# Patient Record
Sex: Female | Born: 1960 | Race: White | Hispanic: No | Marital: Married | Smoking: Never smoker
Health system: Southern US, Community
[De-identification: ages and names within clinical notes are randomized; demographics above are authoritative.]

## PROBLEM LIST (undated history)

## (undated) DIAGNOSIS — G20 Parkinson's disease: Secondary | ICD-10-CM

## (undated) DIAGNOSIS — C189 Malignant neoplasm of colon, unspecified: Secondary | ICD-10-CM

## (undated) DIAGNOSIS — F419 Anxiety disorder, unspecified: Secondary | ICD-10-CM

## (undated) DIAGNOSIS — F32A Depression, unspecified: Secondary | ICD-10-CM

## (undated) DIAGNOSIS — Z9221 Personal history of antineoplastic chemotherapy: Secondary | ICD-10-CM

## (undated) DIAGNOSIS — F329 Major depressive disorder, single episode, unspecified: Secondary | ICD-10-CM

## (undated) DIAGNOSIS — G20A1 Parkinson's disease without dyskinesia, without mention of fluctuations: Secondary | ICD-10-CM

## (undated) DIAGNOSIS — E785 Hyperlipidemia, unspecified: Secondary | ICD-10-CM

## (undated) DIAGNOSIS — K219 Gastro-esophageal reflux disease without esophagitis: Secondary | ICD-10-CM

## (undated) DIAGNOSIS — R251 Tremor, unspecified: Secondary | ICD-10-CM

## (undated) DIAGNOSIS — G709 Myoneural disorder, unspecified: Secondary | ICD-10-CM

## (undated) DIAGNOSIS — T7840XA Allergy, unspecified, initial encounter: Secondary | ICD-10-CM

## (undated) DIAGNOSIS — N3010 Interstitial cystitis (chronic) without hematuria: Secondary | ICD-10-CM

## (undated) DIAGNOSIS — Z8349 Family history of other endocrine, nutritional and metabolic diseases: Secondary | ICD-10-CM

## (undated) DIAGNOSIS — C801 Malignant (primary) neoplasm, unspecified: Secondary | ICD-10-CM

## (undated) DIAGNOSIS — E7800 Pure hypercholesterolemia, unspecified: Secondary | ICD-10-CM

## (undated) HISTORY — DX: Interstitial cystitis (chronic) without hematuria: N30.10

## (undated) HISTORY — DX: Anxiety disorder, unspecified: F41.9

## (undated) HISTORY — DX: Malignant (primary) neoplasm, unspecified: C80.1

## (undated) HISTORY — DX: Family history of other endocrine, nutritional and metabolic diseases: Z83.49

## (undated) HISTORY — DX: Pure hypercholesterolemia, unspecified: E78.00

## (undated) HISTORY — DX: Personal history of antineoplastic chemotherapy: Z92.21

## (undated) HISTORY — DX: Malignant neoplasm of colon, unspecified: C18.9

## (undated) HISTORY — DX: Allergy, unspecified, initial encounter: T78.40XA

## (undated) HISTORY — DX: Parkinson's disease: G20

## (undated) HISTORY — PX: INNER EAR SURGERY: SHX679

## (undated) HISTORY — PX: UPPER GASTROINTESTINAL ENDOSCOPY: SHX188

## (undated) HISTORY — DX: Depression, unspecified: F32.A

## (undated) HISTORY — DX: Parkinson's disease without dyskinesia, without mention of fluctuations: G20.A1

## (undated) HISTORY — PX: POLYPECTOMY: SHX149

## (undated) HISTORY — DX: Gastro-esophageal reflux disease without esophagitis: K21.9

## (undated) HISTORY — DX: Myoneural disorder, unspecified: G70.9

## (undated) HISTORY — DX: Tremor, unspecified: R25.1

## (undated) HISTORY — DX: Hyperlipidemia, unspecified: E78.5

---
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
## 2001-02-16 ENCOUNTER — Other Ambulatory Visit: Admission: RE | Admit: 2001-02-16 | Discharge: 2001-02-16 | Payer: Self-pay | Admitting: Internal Medicine

## 2002-02-16 ENCOUNTER — Other Ambulatory Visit: Admission: RE | Admit: 2002-02-16 | Discharge: 2002-02-16 | Payer: Self-pay | Admitting: Internal Medicine

## 2002-02-22 ENCOUNTER — Ambulatory Visit (HOSPITAL_COMMUNITY): Admission: RE | Admit: 2002-02-22 | Discharge: 2002-02-22 | Payer: Self-pay | Admitting: Internal Medicine

## 2002-02-22 ENCOUNTER — Encounter: Payer: Self-pay | Admitting: Internal Medicine

## 2002-07-05 DIAGNOSIS — C189 Malignant neoplasm of colon, unspecified: Secondary | ICD-10-CM

## 2002-07-05 HISTORY — DX: Malignant neoplasm of colon, unspecified: C18.9

## 2003-05-02 ENCOUNTER — Other Ambulatory Visit: Admission: RE | Admit: 2003-05-02 | Discharge: 2003-05-02 | Payer: Self-pay | Admitting: Pediatrics

## 2003-07-02 ENCOUNTER — Encounter: Admission: RE | Admit: 2003-07-02 | Discharge: 2003-07-02 | Payer: Self-pay | Admitting: Gastroenterology

## 2003-07-06 HISTORY — PX: COLECTOMY: SHX59

## 2003-07-11 ENCOUNTER — Encounter: Admission: RE | Admit: 2003-07-11 | Discharge: 2003-07-20 | Payer: Self-pay

## 2003-07-16 ENCOUNTER — Inpatient Hospital Stay (HOSPITAL_COMMUNITY): Admission: RE | Admit: 2003-07-16 | Discharge: 2003-07-20 | Payer: Self-pay

## 2003-07-16 ENCOUNTER — Encounter (INDEPENDENT_AMBULATORY_CARE_PROVIDER_SITE_OTHER): Payer: Self-pay | Admitting: Specialist

## 2003-08-13 ENCOUNTER — Ambulatory Visit (HOSPITAL_COMMUNITY): Admission: RE | Admit: 2003-08-13 | Discharge: 2003-08-13 | Payer: Self-pay

## 2003-09-10 ENCOUNTER — Other Ambulatory Visit: Admission: RE | Admit: 2003-09-10 | Discharge: 2003-09-10 | Payer: Self-pay | Admitting: Internal Medicine

## 2003-12-25 ENCOUNTER — Ambulatory Visit (HOSPITAL_COMMUNITY): Admission: RE | Admit: 2003-12-25 | Discharge: 2003-12-25 | Payer: Self-pay | Admitting: Hematology & Oncology

## 2004-02-26 ENCOUNTER — Ambulatory Visit (HOSPITAL_COMMUNITY): Admission: RE | Admit: 2004-02-26 | Discharge: 2004-02-26 | Payer: Self-pay | Admitting: Hematology & Oncology

## 2004-05-11 ENCOUNTER — Ambulatory Visit (HOSPITAL_COMMUNITY): Admission: RE | Admit: 2004-05-11 | Discharge: 2004-05-11 | Payer: Self-pay | Admitting: Hematology & Oncology

## 2004-05-27 ENCOUNTER — Ambulatory Visit: Payer: Self-pay | Admitting: Hematology & Oncology

## 2004-06-19 ENCOUNTER — Ambulatory Visit (HOSPITAL_COMMUNITY): Admission: RE | Admit: 2004-06-19 | Discharge: 2004-06-19 | Payer: Self-pay | Admitting: Gastroenterology

## 2004-08-20 ENCOUNTER — Ambulatory Visit (HOSPITAL_COMMUNITY): Admission: RE | Admit: 2004-08-20 | Discharge: 2004-08-20 | Payer: Self-pay | Admitting: Hematology & Oncology

## 2004-08-25 ENCOUNTER — Ambulatory Visit: Payer: Self-pay | Admitting: Hematology & Oncology

## 2004-11-02 ENCOUNTER — Other Ambulatory Visit: Admission: RE | Admit: 2004-11-02 | Discharge: 2004-11-02 | Payer: Self-pay | Admitting: Internal Medicine

## 2004-11-11 ENCOUNTER — Ambulatory Visit: Payer: Self-pay | Admitting: Hematology & Oncology

## 2004-11-11 ENCOUNTER — Ambulatory Visit (HOSPITAL_COMMUNITY): Admission: RE | Admit: 2004-11-11 | Discharge: 2004-11-11 | Payer: Self-pay | Admitting: Hematology & Oncology

## 2005-03-17 ENCOUNTER — Ambulatory Visit (HOSPITAL_COMMUNITY): Admission: RE | Admit: 2005-03-17 | Discharge: 2005-03-17 | Payer: Self-pay | Admitting: Hematology & Oncology

## 2005-03-17 ENCOUNTER — Ambulatory Visit: Payer: Self-pay | Admitting: Hematology & Oncology

## 2005-07-15 ENCOUNTER — Ambulatory Visit: Payer: Self-pay | Admitting: Hematology & Oncology

## 2005-07-16 ENCOUNTER — Ambulatory Visit (HOSPITAL_COMMUNITY): Admission: RE | Admit: 2005-07-16 | Discharge: 2005-07-16 | Payer: Self-pay | Admitting: Hematology & Oncology

## 2005-11-04 ENCOUNTER — Other Ambulatory Visit: Admission: RE | Admit: 2005-11-04 | Discharge: 2005-11-04 | Payer: Self-pay | Admitting: Internal Medicine

## 2005-11-05 ENCOUNTER — Encounter: Admission: RE | Admit: 2005-11-05 | Discharge: 2005-11-05 | Payer: Self-pay | Admitting: Internal Medicine

## 2005-11-25 ENCOUNTER — Ambulatory Visit: Payer: Self-pay | Admitting: Hematology & Oncology

## 2005-11-25 ENCOUNTER — Ambulatory Visit (HOSPITAL_COMMUNITY): Admission: RE | Admit: 2005-11-25 | Discharge: 2005-11-25 | Payer: Self-pay | Admitting: Hematology & Oncology

## 2005-11-25 LAB — CBC WITH DIFFERENTIAL/PLATELET
BASO%: 0.5 % (ref 0.0–2.0)
Basophils Absolute: 0 10*3/uL (ref 0.0–0.1)
Eosinophils Absolute: 0.1 10*3/uL (ref 0.0–0.5)
HCT: 39.7 % (ref 34.8–46.6)
HGB: 13.6 g/dL (ref 11.6–15.9)
MONO#: 0.5 10*3/uL (ref 0.1–0.9)
NEUT#: 3.7 10*3/uL (ref 1.5–6.5)
NEUT%: 66 % (ref 39.6–76.8)
WBC: 5.6 10*3/uL (ref 3.9–10.0)
lymph#: 1.3 10*3/uL (ref 0.9–3.3)

## 2005-11-25 LAB — CEA: CEA: 1.4 ng/mL (ref 0.0–5.0)

## 2005-11-25 LAB — COMPREHENSIVE METABOLIC PANEL
ALT: 10 U/L (ref 0–40)
CO2: 23 mEq/L (ref 19–32)
Calcium: 8.9 mg/dL (ref 8.4–10.5)
Chloride: 103 mEq/L (ref 96–112)
Creatinine, Ser: 0.7 mg/dL (ref 0.4–1.2)

## 2006-05-23 ENCOUNTER — Ambulatory Visit (HOSPITAL_COMMUNITY): Admission: RE | Admit: 2006-05-23 | Discharge: 2006-05-23 | Payer: Self-pay | Admitting: Hematology & Oncology

## 2006-05-30 ENCOUNTER — Ambulatory Visit: Payer: Self-pay | Admitting: Hematology & Oncology

## 2006-05-30 LAB — CBC WITH DIFFERENTIAL/PLATELET
Eosinophils Absolute: 0 10*3/uL (ref 0.0–0.5)
HCT: 36.2 % (ref 34.8–46.6)
LYMPH%: 20.6 % (ref 14.0–48.0)
MCHC: 34.1 g/dL (ref 32.0–36.0)
MCV: 89.2 fL (ref 81.0–101.0)
MONO#: 0.5 10*3/uL (ref 0.1–0.9)
MONO%: 6.9 % (ref 0.0–13.0)
NEUT#: 5 10*3/uL (ref 1.5–6.5)
NEUT%: 71.4 % (ref 39.6–76.8)
Platelets: 236 10*3/uL (ref 145–400)
WBC: 7 10*3/uL (ref 3.9–10.0)

## 2006-05-30 LAB — COMPREHENSIVE METABOLIC PANEL
CO2: 25 mEq/L (ref 19–32)
Creatinine, Ser: 0.66 mg/dL (ref 0.40–1.20)
Glucose, Bld: 94 mg/dL (ref 70–99)
Total Bilirubin: 0.7 mg/dL (ref 0.3–1.2)

## 2006-05-30 LAB — CEA: CEA: 1.2 ng/mL (ref 0.0–5.0)

## 2006-11-30 ENCOUNTER — Ambulatory Visit: Payer: Self-pay | Admitting: Hematology & Oncology

## 2006-11-30 ENCOUNTER — Ambulatory Visit (HOSPITAL_COMMUNITY): Admission: RE | Admit: 2006-11-30 | Discharge: 2006-11-30 | Payer: Self-pay | Admitting: Hematology & Oncology

## 2006-11-30 LAB — COMPREHENSIVE METABOLIC PANEL
ALT: 13 U/L (ref 0–35)
AST: 19 U/L (ref 0–37)
Albumin: 3.9 g/dL (ref 3.5–5.2)
Calcium: 9 mg/dL (ref 8.4–10.5)
Chloride: 106 mEq/L (ref 96–112)
Potassium: 3.9 mEq/L (ref 3.5–5.3)
Sodium: 136 mEq/L (ref 135–145)
Total Protein: 6.6 g/dL (ref 6.0–8.3)

## 2006-11-30 LAB — CBC WITH DIFFERENTIAL/PLATELET
BASO%: 0.6 % (ref 0.0–2.0)
Basophils Absolute: 0 10*3/uL (ref 0.0–0.1)
EOS%: 0.7 % (ref 0.0–7.0)
HGB: 12.6 g/dL (ref 11.6–15.9)
MCH: 30.8 pg (ref 26.0–34.0)
MCHC: 35.5 g/dL (ref 32.0–36.0)
RDW: 13.4 % (ref 11.3–14.5)
lymph#: 1.1 10*3/uL (ref 0.9–3.3)

## 2006-12-09 ENCOUNTER — Other Ambulatory Visit: Admission: RE | Admit: 2006-12-09 | Discharge: 2006-12-09 | Payer: Self-pay | Admitting: Family Medicine

## 2007-07-07 ENCOUNTER — Ambulatory Visit: Payer: Self-pay | Admitting: Hematology & Oncology

## 2007-07-07 ENCOUNTER — Ambulatory Visit (HOSPITAL_COMMUNITY): Admission: RE | Admit: 2007-07-07 | Discharge: 2007-07-07 | Payer: Self-pay | Admitting: Hematology & Oncology

## 2007-07-07 LAB — COMPREHENSIVE METABOLIC PANEL
AST: 17 U/L (ref 0–37)
Albumin: 4.8 g/dL (ref 3.5–5.2)
Alkaline Phosphatase: 74 U/L (ref 39–117)
BUN: 16 mg/dL (ref 6–23)
Potassium: 3.8 mEq/L (ref 3.5–5.3)
Sodium: 137 mEq/L (ref 135–145)
Total Bilirubin: 0.6 mg/dL (ref 0.3–1.2)

## 2007-07-07 LAB — CEA: CEA: 1.2 ng/mL (ref 0.0–5.0)

## 2007-07-07 LAB — CBC WITH DIFFERENTIAL/PLATELET
EOS%: 2.1 % (ref 0.0–7.0)
MCH: 30.5 pg (ref 26.0–34.0)
MCV: 86.9 fL (ref 81.0–101.0)
MONO%: 6.6 % (ref 0.0–13.0)
RBC: 4.17 10*6/uL (ref 3.70–5.32)
RDW: 10.5 % — ABNORMAL LOW (ref 11.3–14.5)

## 2007-12-11 ENCOUNTER — Other Ambulatory Visit: Admission: RE | Admit: 2007-12-11 | Discharge: 2007-12-11 | Payer: Self-pay | Admitting: Family Medicine

## 2008-02-06 ENCOUNTER — Ambulatory Visit: Payer: Self-pay | Admitting: Hematology & Oncology

## 2008-02-08 ENCOUNTER — Ambulatory Visit (HOSPITAL_COMMUNITY): Admission: RE | Admit: 2008-02-08 | Discharge: 2008-02-08 | Payer: Self-pay | Admitting: Hematology & Oncology

## 2008-02-08 LAB — CBC WITH DIFFERENTIAL/PLATELET
EOS%: 0.7 % (ref 0.0–7.0)
Eosinophils Absolute: 0 10*3/uL (ref 0.0–0.5)
LYMPH%: 17.3 % (ref 14.0–48.0)
MCH: 30.3 pg (ref 26.0–34.0)
MCHC: 34.3 g/dL (ref 32.0–36.0)
MCV: 88.2 fL (ref 81.0–101.0)
MONO%: 6.3 % (ref 0.0–13.0)
NEUT#: 4.6 10*3/uL (ref 1.5–6.5)
Platelets: 204 10*3/uL (ref 145–400)
RBC: 4.33 10*6/uL (ref 3.70–5.32)
RDW: 12.9 % (ref 11.3–14.5)

## 2008-02-08 LAB — COMPREHENSIVE METABOLIC PANEL
AST: 15 U/L (ref 0–37)
Alkaline Phosphatase: 66 U/L (ref 39–117)
Glucose, Bld: 90 mg/dL (ref 70–99)
Potassium: 3.9 mEq/L (ref 3.5–5.3)
Sodium: 137 mEq/L (ref 135–145)
Total Bilirubin: 0.9 mg/dL (ref 0.3–1.2)
Total Protein: 7.2 g/dL (ref 6.0–8.3)

## 2008-02-13 ENCOUNTER — Ambulatory Visit: Payer: Self-pay | Admitting: Hematology & Oncology

## 2008-08-07 ENCOUNTER — Ambulatory Visit (HOSPITAL_COMMUNITY): Admission: RE | Admit: 2008-08-07 | Discharge: 2008-08-07 | Payer: Self-pay | Admitting: Hematology & Oncology

## 2008-08-13 ENCOUNTER — Ambulatory Visit: Payer: Self-pay | Admitting: Hematology & Oncology

## 2008-12-24 ENCOUNTER — Other Ambulatory Visit: Admission: RE | Admit: 2008-12-24 | Discharge: 2008-12-24 | Payer: Self-pay | Admitting: Family Medicine

## 2009-02-19 ENCOUNTER — Ambulatory Visit: Payer: Self-pay | Admitting: Hematology & Oncology

## 2009-02-19 ENCOUNTER — Ambulatory Visit (HOSPITAL_COMMUNITY): Admission: RE | Admit: 2009-02-19 | Discharge: 2009-02-19 | Payer: Self-pay | Admitting: Hematology & Oncology

## 2009-02-19 LAB — CBC WITH DIFFERENTIAL/PLATELET
BASO%: 0.2 % (ref 0.0–2.0)
Basophils Absolute: 0 10*3/uL (ref 0.0–0.1)
EOS%: 0.7 % (ref 0.0–7.0)
HCT: 38.1 % (ref 34.8–46.6)
HGB: 12.9 g/dL (ref 11.6–15.9)
LYMPH%: 19.4 % (ref 14.0–49.7)
MCH: 29.5 pg (ref 25.1–34.0)
MCHC: 33.9 g/dL (ref 31.5–36.0)
MCV: 87.2 fL (ref 79.5–101.0)
MONO%: 6.1 % (ref 0.0–14.0)
NEUT%: 73.6 % (ref 38.4–76.8)
lymph#: 1.1 10*3/uL (ref 0.9–3.3)

## 2009-02-19 LAB — COMPREHENSIVE METABOLIC PANEL
ALT: 19 U/L (ref 0–35)
AST: 21 U/L (ref 0–37)
CO2: 22 mEq/L (ref 19–32)
Chloride: 104 mEq/L (ref 96–112)
Creatinine, Ser: 0.71 mg/dL (ref 0.40–1.20)
Sodium: 139 mEq/L (ref 135–145)
Total Bilirubin: 0.7 mg/dL (ref 0.3–1.2)
Total Protein: 6.9 g/dL (ref 6.0–8.3)

## 2009-02-19 LAB — LACTATE DEHYDROGENASE: LDH: 150 U/L (ref 94–250)

## 2009-02-19 LAB — CEA: CEA: 0.9 ng/mL (ref 0.0–5.0)

## 2009-03-04 ENCOUNTER — Ambulatory Visit: Payer: Self-pay | Admitting: Hematology & Oncology

## 2009-12-29 ENCOUNTER — Other Ambulatory Visit
Admission: RE | Admit: 2009-12-29 | Discharge: 2009-12-29 | Payer: Self-pay | Source: Home / Self Care | Admitting: Family Medicine

## 2010-03-03 ENCOUNTER — Ambulatory Visit: Payer: Self-pay | Admitting: Hematology & Oncology

## 2010-03-04 LAB — COMPREHENSIVE METABOLIC PANEL
ALT: 17 U/L (ref 0–35)
AST: 20 U/L (ref 0–37)
Albumin: 4.6 g/dL (ref 3.5–5.2)
Alkaline Phosphatase: 62 U/L (ref 39–117)
Potassium: 4 mEq/L (ref 3.5–5.3)
Sodium: 139 mEq/L (ref 135–145)
Total Bilirubin: 0.5 mg/dL (ref 0.3–1.2)
Total Protein: 6.5 g/dL (ref 6.0–8.3)

## 2010-03-04 LAB — CBC WITH DIFFERENTIAL (CANCER CENTER ONLY)
BASO%: 0.4 % (ref 0.0–2.0)
EOS%: 2.2 % (ref 0.0–7.0)
Eosinophils Absolute: 0.1 10*3/uL (ref 0.0–0.5)
MCH: 29.5 pg (ref 26.0–34.0)
MCHC: 33.1 g/dL (ref 32.0–36.0)
MONO%: 5.4 % (ref 0.0–13.0)
NEUT#: 4 10*3/uL (ref 1.5–6.5)
Platelets: 227 10*3/uL (ref 145–400)
RBC: 4.38 10*6/uL (ref 3.70–5.32)
RDW: 11.2 % (ref 10.5–14.6)

## 2010-07-26 ENCOUNTER — Encounter: Payer: Self-pay | Admitting: Hematology & Oncology

## 2010-07-30 ENCOUNTER — Ambulatory Visit (INDEPENDENT_AMBULATORY_CARE_PROVIDER_SITE_OTHER)
Admission: RE | Admit: 2010-07-30 | Discharge: 2010-07-30 | Payer: Self-pay | Source: Home / Self Care | Attending: Family Medicine | Admitting: Family Medicine

## 2010-07-30 DIAGNOSIS — Z1231 Encounter for screening mammogram for malignant neoplasm of breast: Secondary | ICD-10-CM

## 2010-10-20 LAB — COMPREHENSIVE METABOLIC PANEL
Alkaline Phosphatase: 67 U/L (ref 39–117)
BUN: 10 mg/dL (ref 6–23)
CO2: 24 mEq/L (ref 19–32)
Calcium: 9.6 mg/dL (ref 8.4–10.5)
GFR calc non Af Amer: >60 mL/min (ref >60)
Glucose, Bld: 103 mg/dL — ABNORMAL HIGH (ref 70–99)
Total Protein: 6.7 g/dL (ref 6.0–8.3)

## 2010-10-20 LAB — CBC
HCT: 38 % (ref 36.0–46.0)
Hemoglobin: 12.6 g/dL (ref 12.0–15.0)
MCV: 89.4 fL (ref 78.0–100.0)
Platelets: 203 10*3/uL (ref 150–400)
RDW: 13 % (ref 11.5–15.5)

## 2010-11-20 NOTE — Op Note (Signed)
NAME:  Lindsay Johnson, Lindsay Johnson                           ACCOUNT NO.:  [id number]   MEDICAL RECORD NO.:  [id number]                   PATIENT TYPE:  AMB   LOCATION:  DAY                                  FACILITY:  [facility]   PHYSICIAN:  Lorre Munroe., M.D.            DATE OF BIRTH:  [birth year]   DATE OF PROCEDURE:  08/13/2003  DATE OF DISCHARGE:                                 OPERATIVE REPORT   PREOPERATIVE DIAGNOSIS:  Carcinoma of the colon, chemotherapy planned.   POSTOPERATIVE DIAGNOSIS:  Carcinoma of the colon, chemotherapy planned.   OPERATION:  Implantation of a venous access port.   SURGEON:  Lebron Conners, M.D.   ANESTHESIA:  Local with sedation.   DESCRIPTION OF PROCEDURE:  After the patient was monitored and sedated and  had routine preparation and draping of the anterior chest and neck, I  liberally infused local anesthetic in the area of the left infraclavicular  region and deltopectoral groove and anterior chest wall.  I made a small  incision just below the clavicle in the deltopectoral groove and with the  patient in Trendelenburg position, I accessed the subclavian vein with a  single stick of the locator needle, and I passed a J-wire through and  confirmed fluoroscopically that it went into the superior vena cava.  I then  created a subcutaneous pocket on the anterior chest wall near the site of  insertion of the wire through approximately a 2 cm incision and removed a  small amount of fat to provide better access to the port.  I then sutured in  the reattached X-Port device with two sutures of 2-0 Prolene, and it lay in  a nice, comfortable position well-covered by the skin.  I passed the venous  tubing through to the venous access incision and then using fl uroscopy cut  it to needed length to reach the distal superior vena cava.  Again with the  patient in Trendelenburg position I passed a dilator and introducer assembly  over the J-wire and removed the wire.  I  then removed the dilator after  confirming that its tip was intravascular and then passed the venous tubing  through the introducer and peeled the introducer out while holding the  catheter in.  Again using fluoroscopy I confirmed that the tip of the tubing  was in the superior vena cava.  This allowed easy injection of fluid and  easy withdrawal of blood.  During the passage of the introducer and dilator  and thereafter, the patient experienced some slight pain and pleurisy in the  right side of the chest.  Chest x-ray is pending at this time.  She remained  stable and had 100% hemoglobin saturation at all times.  After confirming  good position of the catheter fluoroscopically I closed the skin incisions  with intracuticular 4-0 Vicryl and Steri-Strips.  I then put a right angle  Demetrios Isaacs with injection  tubing into the port and filled the port and tubing  with concentrated heparin solution.  The patient then went to PACU in stable  condition.                                               Lorre Munroe., M.D.   Jodi Marble  D:  08/13/2003  T:  08/13/2003  Job:  [id number]   cc:   Rose Phi. Myna Hidalgo, M.D.  [address] [facility]  [place], [place] [address]  Fax: [phone]

## 2010-11-20 NOTE — H&P (Signed)
Lindsay Johnson, Lindsay Johnson [facility]                         ACCOUNT NO.:  [id number]   MEDICAL RECORD NO.:  [id number]                   PATIENT TYPE:  INP   LOCATION:  0009                                 FACILITY:  [facility]   PHYSICIAN:  Lorre Munroe., M.D.            DATE OF BIRTH:  May 19, 1961   DATE OF ADMISSION:  07/16/2003  DATE OF DISCHARGE:                                HISTORY & PHYSICAL   CHIEF COMPLAINT:  Colon cancer.   HISTORY OF PRESENT ILLNESS:  The patient is an otherwise healthy 50 year old  white female who noted rectal bleeding.  She underwent sigmoidoscopy showing  a tumor of the distal sigmoid and biopsy showed carcinoma.  She underwent a  total colonoscopy with the finding of no other lesions.  After taking an  outpatient bowel prep she is admitted to the hospital to undergo resection.  She has had a CT scan which shows no evidence of metastatic disease in the  abdomen or in the liver.   PAST MEDICAL HISTORY:  1. History of interstitial cystitis treated non-operatively.  2. Great deal of trouble taking narcotic medications because of nausea.   ALLERGIES:  SULFA DRUGS.   PAST SURGICAL HISTORY:  1. Ear surgery in 1993 twice.  2. Cystoscopy in 1996 and in 2001.   She denies heart disease, lung disease and other serious chronic problems.   MEDICATIONS:  1. Zoloft 100 mg a daily.  2. Elmiron one tablet daily.   SOCIAL HISTORY:  She does not smoke.  She drinks alcoholic beverages  occasionally and moderately.   REVIEW OF SYMPTOMS:  Remarkable for anxiety and is otherwise unremarkable  except as mentioned above.   PHYSICAL EXAMINATION:  GENERAL:  This is a healthy appearing young woman.  She is in no acute distress.  VITAL SIGNS:  Normal.  HEENT:  Head, neck, eyes, ears, nose, mouth and throat are unremarkable.  Specifically, there is no enlargement of the thyroid and no supraclavicular  lymphadenopathy.  CHEST:  Clear to auscultation.  BREASTS:  Normal.  HEART:   Rate and rhythm are normal.  No murmur or gallop.  ABDOMEN:  No mass, tenderness or organomegaly.  EXTREMITIES:  Normal.  SKIN:  Normal.  There were numerous small skin tags none of which appeared  remarkable.  LYMPH NODES:  No lesions are noted.  NEUROLOGIC:  Normal.   IMPRESSION:  1. Cancer of the sigmoid colon.  2. Multiple soft nevi of the skin.   PLAN:  Sigmoid colectomy and destruction of nevi.                                               Lorre Munroe., M.D.    WB/MEDQ  D:  07/16/2003  T:  07/16/2003  Job:  [id number]

## 2010-11-20 NOTE — Op Note (Signed)
NAMEDAYNE, DEKAY [facility]                         ACCOUNT NO.:  [id number]   MEDICAL RECORD NO.:  [id number]                   PATIENT TYPE:  INP   LOCATION:  0483                                 FACILITY:  [facility]   PHYSICIAN:  Lorre Munroe., M.D.            DATE OF BIRTH:  Dec 22, 1960   DATE OF PROCEDURE:  07/16/2003  DATE OF DISCHARGE:                                 OPERATIVE REPORT   PREOPERATIVE DIAGNOSES:  1. Carcinoma of the sigmoid colon.  2. Multiple nevi of the trunk.   POSTOPERATIVE DIAGNOSES:  1. Carcinoma of the sigmoid colon.  2. Multiple nevi of the trunk.   OPERATION:  1. Sigmoid colectomy with anastomosis.  2. Destruction of nevi.   SURGEON:  Zigmund Daniel, M.D.   ASSISTANT:  Sandria Bales. Ezzard Standing, M.D.   ANESTHESIA:  General.   DESCRIPTION OF PROCEDURE:  After the patient was monitored and anesthetized  and had a Foley catheter in the bladder and routine preparation and draping  of the abdomen, I made a lower midline incision, staying below the umbilicus  and stopping just above the symphysis.  After getting hemostasis in the  subcutaneous tissues, I entered the peritoneal cavity after incising the  fascia and explored the abdomen.  There was a small tumor of the distal  sigmoid colon, and it was quite mobile.  I found no evidence of any  peritoneum metastases, lymph node metastases, or liver metastases.  I found  no other abnormalities.  I placed the patient in Trendelenburg position and  packed the small intestine upward and incised the peritoneum lateral to the  sigmoid colon and the distal descending colon, gaining good mobility.  I  then dissected distally, on the left side passed the tumor, incising the  peritoneum, noting the ureter and avoiding injury to it.  I elected a site  of resection in the proximal sigmoid and divided the bowel between clamps.  I then segmentally divided the mesentery directly back to the sacral  promontory, ligating the  large vessels with 2-0 silk and using the Bovie  otherwise.  I then incised the peritoneum on the right side of the colon  going distally and came back up to the bowel.  I lifted the bowel up from  the sacrum and gained good mobility of the rectosigmoid.  I then used a  couple more clamps to complete the mesenteric resection distally, ligating  with 2-0 silks.  I put stay sutures on the lateral sides of the rectosigmoid  and resected the bowel and inspected it to assure that I had a good distal  margin and found that to be the case.  I saw no polyps or other  abnormalities distally.  I then brought the proximal end of the bowel down,  and I anastomosed it to the distal bowel, utilizing interrupted full-  thickness real silk sutures.  Vascularity was good, and I was well  satisfied  with the anastomosis.  I closed the mesenteric defect with interrupted 2-0  silk sutures.  I copiously irrigated the pelvis and removed the irrigant.  There was no evidence persistent bleeding.  Sponge, needle, and instrument  counts were correct.  I closed the abdominal fascia with #1 PDS and closed  the skin with staples after irrigating the subcutaneous tissues.  I then  shaved off and  cauterized the base of three soft skin tags which the patient had pointed  out to me on the abdominal wall flank and back which were bothering her.  They were not sent as surgical specimens.  After application of bandages,  the patient went to PACU in stable condition.                                               Lorre Munroe., M.D.    WB/MEDQ  D:  07/16/2003  T:  07/16/2003  Job:  [id number]   cc:   Tasia Catchings, M.D.  [address]  [place]  [place] [address]  Fax: [phone]   Marcene Duos, M.D.  [address] [place]  [place] [address]  Fax: [phone]

## 2010-11-20 NOTE — Discharge Summary (Signed)
Lindsay Johnson, MAGGI [facility]                         ACCOUNT NO.:  [id number]   MEDICAL RECORD NO.:  [id number]                   PATIENT TYPE:  INP   LOCATION:  0483                                 FACILITY:  [facility]   PHYSICIAN:  Lorre Munroe., M.D.            DATE OF BIRTH:  1960-09-02   DATE OF ADMISSION:  07/16/2003  DATE OF DISCHARGE:  07/20/2003                                 DISCHARGE SUMMARY   HISTORY:  This is a 50 year old white female, who underwent colonoscopy  because of rectal bleeding.  She was found to have a tumor of the distal  sigmoid colon, and biopsy showed cancer.  By a CT scan and examination, the  patient had no evidence of metastatic disease, and she underwent a bowel  prep at home and entered the hospital for elective colectomy.  She had  agreed to participate in the drug study, studying medication for reduction  of postoperative ileus.   PAST MEDICAL HISTORY:  1. Interstitial cystitis, on Elmiron.  2. Anxiety, Zoloft.  3. She denies serious chronic ailments.  She has a great deal of difficulty     tolerating narcotic medicines.  See the history and physical for greater     details.   PHYSICAL EXAMINATION:  Entirely unremarkable.   HOSPITAL COURSE:  On the day of admission, the patient underwent sigmoid  colectomy with primary anastomosis.  There was no evidence of any liver,  peritoneal, or mesenteric spread of cancer.  Final pathological analysis  disclosed that it was growing to the surface of the bowel, but that all of  the mesenteric lymph nodes were negative for cancer.  She made a very good,  uneventful recovery with full return of GI function by the fourth  postoperative day, at which time her incision also was healing very nicely.  She was allowed to go home with her staples in place on the fourth  postoperative day and was to see me at the office in a few days subsequent  to that to have removal of her staples.   DIAGNOSIS:  Carcinoma of the  distal sigmoid colon.   OPERATION:  Sigmoid colectomy with anastomosis.   DISCHARGE CONDITION:  Improved.                                               Lorre Munroe., M.D.    Jodi Marble  D:  07/27/2003  T:  07/27/2003  Job:  [id number]   cc:   Tasia Catchings, M.D.  [address]  [place]  [place] [address]  Fax: [phone]   [facility]   Marcene Duos, M.D.  [address] [place]  [place] [address]  Fax: [phone]

## 2011-03-03 ENCOUNTER — Encounter (HOSPITAL_BASED_OUTPATIENT_CLINIC_OR_DEPARTMENT_OTHER): Payer: 59 | Admitting: Hematology & Oncology

## 2011-03-03 ENCOUNTER — Other Ambulatory Visit: Payer: Self-pay | Admitting: Hematology & Oncology

## 2011-03-03 DIAGNOSIS — C187 Malignant neoplasm of sigmoid colon: Secondary | ICD-10-CM

## 2011-03-04 LAB — COMPREHENSIVE METABOLIC PANEL
ALT: 15 U/L (ref 0–35)
AST: 15 U/L (ref 0–37)
Albumin: 4.6 g/dL (ref 3.5–5.2)
Alkaline Phosphatase: 69 U/L (ref 39–117)
Glucose, Bld: 101 mg/dL — ABNORMAL HIGH (ref 70–99)
Potassium: 3.7 mEq/L (ref 3.5–5.3)
Sodium: 139 mEq/L (ref 135–145)
Total Protein: 6.4 g/dL (ref 6.0–8.3)

## 2011-03-04 LAB — CEA: CEA: 1.3 ng/mL (ref 0.0–5.0)

## 2011-05-20 ENCOUNTER — Other Ambulatory Visit (HOSPITAL_BASED_OUTPATIENT_CLINIC_OR_DEPARTMENT_OTHER): Payer: Self-pay | Admitting: Internal Medicine

## 2011-05-20 DIAGNOSIS — Z1231 Encounter for screening mammogram for malignant neoplasm of breast: Secondary | ICD-10-CM

## 2011-06-08 DIAGNOSIS — H7010 Chronic mastoiditis, unspecified ear: Secondary | ICD-10-CM | POA: Insufficient documentation

## 2011-06-08 DIAGNOSIS — H9011 Conductive hearing loss, unilateral, right ear, with unrestricted hearing on the contralateral side: Secondary | ICD-10-CM | POA: Insufficient documentation

## 2011-08-02 ENCOUNTER — Ambulatory Visit (HOSPITAL_BASED_OUTPATIENT_CLINIC_OR_DEPARTMENT_OTHER): Payer: 59

## 2011-08-03 ENCOUNTER — Ambulatory Visit (HOSPITAL_BASED_OUTPATIENT_CLINIC_OR_DEPARTMENT_OTHER)
Admission: RE | Admit: 2011-08-03 | Discharge: 2011-08-03 | Disposition: A | Discharge status: Home / Self Care | Payer: 59 | Source: Ambulatory Visit | Attending: Internal Medicine | Admitting: Internal Medicine

## 2011-08-03 DIAGNOSIS — Z1231 Encounter for screening mammogram for malignant neoplasm of breast: Secondary | ICD-10-CM | POA: Insufficient documentation

## 2011-09-27 DIAGNOSIS — J309 Allergic rhinitis, unspecified: Secondary | ICD-10-CM | POA: Insufficient documentation

## 2012-01-27 ENCOUNTER — Telehealth: Payer: Self-pay | Admitting: Hematology & Oncology

## 2012-01-27 NOTE — Telephone Encounter (Signed)
faxed records for research study request

## 2012-02-23 ENCOUNTER — Other Ambulatory Visit: Payer: 59 | Admitting: Lab

## 2012-02-23 ENCOUNTER — Ambulatory Visit (HOSPITAL_BASED_OUTPATIENT_CLINIC_OR_DEPARTMENT_OTHER): Payer: 59 | Admitting: Hematology & Oncology

## 2012-02-23 VITALS — BP 113/72 | HR 78 | Temp 98.2°F | Resp 18 | Ht 64.0 in | Wt 182.0 lb

## 2012-02-23 DIAGNOSIS — Z85038 Personal history of other malignant neoplasm of large intestine: Secondary | ICD-10-CM

## 2012-02-23 DIAGNOSIS — Z9189 Other specified personal risk factors, not elsewhere classified: Secondary | ICD-10-CM

## 2012-02-23 NOTE — Progress Notes (Signed)
This office note has been dictated.

## 2012-02-24 NOTE — Progress Notes (Signed)
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  CURRENT THERAPY:  Observation.  INTERIM HISTORY:  Ms. Locken comes in for her yearly followup.  As always, she got back from her family vacation up in [place].  She got back about 2 weeks ago.  The weather was really nice up there, she said. They had a great time.  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  Her kids are doing well.  She has done well physically.  She sees one of the doctors out at [facility] for her health care.  She had lab work done a week or so ago.  Her vitamin D was a little bit low.  She is taking 2000 units a day.  I told her to double that.  Otherwise, her lab work looked great. She had normal liver function tests.  She was not anemic.  She goes back for a colonoscopy, which is her routine 5-year followup early next year.  She is getting her mammograms.  So far, there have been no problems with respect to her mammograms.  We do follow her CEA levels.  The last ones that we did back last year all looked okay.  Her CEA was 1.3.  This has been holding very stable.  She has had no change in bowel or bladder habits.  There has been no cough or shortness breath.  There has been no leg swelling.  There has been no rashes.  She has had no swallowing difficulties.  PHYSICAL EXAMINATION:  General:  This is a well-developed, well- nourished white female in no obvious distress.  Vital Signs: Temperature 98.2, pulse 78, respiratory rate 18, blood pressure 113/72, weight is 182.  Head and Neck Exam:  Shows a normocephalic, atraumatic skull.  There are no ocular or oral lesions.  She has no palpable cervical or supraclavicular lymph nodes.  Lungs:  Clear bilaterally. Cardiac Exam:  Regular rate and rhythm with a normal S1 and S2.  There are no murmurs, rubs, or bruits.  Abdominal Exam:  Soft  with good bowel sounds.  She has a well-healed laparotomy scar.  There is no guarding or rebound tenderness.  There is no fluid wave.  There is no palpable hepatosplenomegaly.  Back Exam:  No tenderness over the spine, ribs, or hips.  Extremities:  Show no clubbing, cyanosis, or edema.  She has good range of motion of her joints.  Neurological Exam.  No focal neurological deficits.  Skin Exam:  No rashes, ecchymosis, or petechia.  IMPRESSION:  Ms. Gilberti is a very nice 51 year old white female with history of stage II adenocarcinoma of the sigmoid colon.  We did go ahead and give her FOLFOX.  I felt that she was at an increased risk for recurrence and as such, I felt that she would benefit from adjuvant chemotherapy.  She completed chemo back in June 2005.  We will go ahead and plan to get her back in 1 year.  I think that if all looks good next year, we really can consider discharging her from the practice, as I think that she is cured and the risk of recurrence would be less than 2%.    ______________________________ Josph Macho, M.D. PRE/MEDQ  D:  02/23/2012  T:  02/24/2012  Job:  [id number]

## 2012-04-11 ENCOUNTER — Other Ambulatory Visit (HOSPITAL_BASED_OUTPATIENT_CLINIC_OR_DEPARTMENT_OTHER): Payer: Self-pay | Admitting: Internal Medicine

## 2012-04-11 DIAGNOSIS — Z1231 Encounter for screening mammogram for malignant neoplasm of breast: Secondary | ICD-10-CM

## 2012-04-20 DIAGNOSIS — H04209 Unspecified epiphora, unspecified lacrimal gland: Secondary | ICD-10-CM | POA: Insufficient documentation

## 2012-04-20 DIAGNOSIS — H04569 Stenosis of unspecified lacrimal punctum: Secondary | ICD-10-CM | POA: Insufficient documentation

## 2012-08-03 ENCOUNTER — Ambulatory Visit (HOSPITAL_BASED_OUTPATIENT_CLINIC_OR_DEPARTMENT_OTHER)
Admission: RE | Admit: 2012-08-03 | Discharge: 2012-08-03 | Disposition: A | Discharge status: Home / Self Care | Payer: 59 | Source: Ambulatory Visit | Attending: Internal Medicine | Admitting: Internal Medicine

## 2012-08-03 DIAGNOSIS — Z1231 Encounter for screening mammogram for malignant neoplasm of breast: Secondary | ICD-10-CM

## 2013-02-21 ENCOUNTER — Ambulatory Visit: Payer: 59 | Admitting: Hematology & Oncology

## 2013-02-21 ENCOUNTER — Other Ambulatory Visit: Payer: 59 | Admitting: Lab

## 2013-02-21 ENCOUNTER — Other Ambulatory Visit (HOSPITAL_BASED_OUTPATIENT_CLINIC_OR_DEPARTMENT_OTHER): Payer: 59 | Admitting: Lab

## 2013-02-21 ENCOUNTER — Encounter: Payer: Self-pay | Admitting: Hematology & Oncology

## 2013-02-21 ENCOUNTER — Ambulatory Visit (HOSPITAL_BASED_OUTPATIENT_CLINIC_OR_DEPARTMENT_OTHER): Payer: 59 | Admitting: Hematology & Oncology

## 2013-02-21 ENCOUNTER — Telehealth: Payer: Self-pay | Admitting: Hematology & Oncology

## 2013-02-21 VITALS — BP 122/74 | HR 76 | Temp 98.4°F | Resp 16 | Ht 64.0 in | Wt 174.0 lb

## 2013-02-21 DIAGNOSIS — Z9189 Other specified personal risk factors, not elsewhere classified: Secondary | ICD-10-CM

## 2013-02-21 DIAGNOSIS — C187 Malignant neoplasm of sigmoid colon: Secondary | ICD-10-CM

## 2013-02-21 NOTE — Telephone Encounter (Signed)
Mailed 8-19 schedule

## 2013-02-21 NOTE — Progress Notes (Signed)
This office note has been dictated.

## 2013-02-22 ENCOUNTER — Encounter: Payer: Self-pay | Admitting: Hematology & Oncology

## 2013-02-22 NOTE — Progress Notes (Signed)
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  CURRENT THERAPY:  Observation.  INTERIM HISTORY:  Lindsay Johnson comes in for followup.  We see her yearly. As always, she had a good year last year.  She went up to the [place] region in [place].  They have a house up there.  She had a good time up there, she was up there for 2 months.  She recently had a colonoscopy.  This was done out at [facility].  She had a of couple polyps removed.  The pathology on the polyps was adenomatous polyps.  She had mutation testing.  She was negative for any the genetic mutations for colon cancer.  She had a mammogram back in January.  Mammogram looked fine.  She has had no change in bowel or bladder habits.  There has been no nausea or vomiting.  She is losing weight.  She is watching her cholesterol.  She is on Zocor.  PHYSICAL EXAMINATION:  General:  This is a well-developed, well- nourished white female in no obvious distress.  Vital signs: Temperature of 98.4, pulse 76, respiratory rate 16, blood pressure 122/74.  Weight is 174.  Head and neck:  Normocephalic, atraumatic skull.  There are no ocular or oral lesions.  There are no palpable cervical or supraclavicular lymph nodes.  Lungs:  Clear bilaterally. Cardiac:  Regular rate and rhythm with a normal S1 and S2.  There are no murmurs, rubs or bruits.  Abdomen:  Soft.  She has good bowel sounds. There is no fluid wave.  There is no palpable hepatosplenomegaly. Extremities:  Show no clubbing, cyanosis or edema.  Skin:  No rashes, ecchymosis, or petechiae.  LABORATORY STUDIES:  These were not done by [place].  They were done at [facility].  Her CEA was 1.4.  IMPRESSION:  Lindsay Johnson is a very charming 52 year old white female with history of stage II adenocarcinoma of the sigmoid colon.  She did undergo adjuvant chemotherapy.  She had FOLFOX.  She completed chemo back in June of 2005.  We will go  ahead and continue to follow her yearly.  I believe that she probably needs a colonoscopy every 3 years.   ______________________________ Josph Macho, M.D. PRE/MEDQ  D:  02/21/2013  T:  02/22/2013  Job:  [id number]

## 2013-07-05 HISTORY — PX: TYMPANOSTOMY TUBE PLACEMENT: SHX32

## 2014-02-20 ENCOUNTER — Other Ambulatory Visit (HOSPITAL_BASED_OUTPATIENT_CLINIC_OR_DEPARTMENT_OTHER): Payer: 59 | Admitting: Lab

## 2014-02-20 ENCOUNTER — Ambulatory Visit (HOSPITAL_BASED_OUTPATIENT_CLINIC_OR_DEPARTMENT_OTHER): Payer: 59 | Admitting: Hematology & Oncology

## 2014-02-20 ENCOUNTER — Encounter: Payer: Self-pay | Admitting: Hematology & Oncology

## 2014-02-20 VITALS — BP 117/67 | HR 58 | Temp 98.2°F | Resp 14 | Ht 64.0 in | Wt 168.0 lb

## 2014-02-20 DIAGNOSIS — C187 Malignant neoplasm of sigmoid colon: Secondary | ICD-10-CM

## 2014-02-20 DIAGNOSIS — Z9189 Other specified personal risk factors, not elsewhere classified: Secondary | ICD-10-CM

## 2014-02-20 DIAGNOSIS — Z85038 Personal history of other malignant neoplasm of large intestine: Secondary | ICD-10-CM

## 2014-02-20 LAB — CMP (CANCER CENTER ONLY)
ALT(SGPT): 6 U/L — ABNORMAL LOW (ref 10–47)
AST: 19 U/L (ref 11–38)
Albumin: 3.7 g/dL (ref 3.3–5.5)
Alkaline Phosphatase: 77 U/L (ref 26–84)
BUN: 14 mg/dL (ref 7–22)
CALCIUM: 9.4 mg/dL (ref 8.0–10.3)
CHLORIDE: 103 meq/L (ref 98–108)
CO2: 28 mEq/L (ref 18–33)
Creat: 0.7 mg/dl (ref 0.6–1.2)
GLUCOSE: 73 mg/dL (ref 73–118)
POTASSIUM: 3.9 meq/L (ref 3.3–4.7)
Sodium: 143 mEq/L (ref 128–145)
Total Bilirubin: 0.7 mg/dl (ref 0.20–1.60)
Total Protein: 6.9 g/dL (ref 6.4–8.1)

## 2014-02-20 LAB — CBC WITH DIFFERENTIAL (CANCER CENTER ONLY)
BASO#: 0 10*3/uL (ref 0.0–0.2)
BASO%: 0.4 % (ref 0.0–2.0)
EOS ABS: 0.1 10*3/uL (ref 0.0–0.5)
EOS%: 1.1 % (ref 0.0–7.0)
HCT: 38.2 % (ref 34.8–46.6)
HGB: 12.7 g/dL (ref 11.6–15.9)
LYMPH#: 1 10*3/uL (ref 0.9–3.3)
LYMPH%: 21.5 % (ref 14.0–48.0)
MCH: 28.8 pg (ref 26.0–34.0)
MCHC: 33.2 g/dL (ref 32.0–36.0)
MCV: 87 fL (ref 81–101)
MONO#: 0.4 10*3/uL (ref 0.1–0.9)
MONO%: 7.9 % (ref 0.0–13.0)
NEUT%: 69.1 % (ref 39.6–80.0)
NEUTROS ABS: 3.2 10*3/uL (ref 1.5–6.5)
PLATELETS: 163 10*3/uL (ref 145–400)
RBC: 4.41 10*6/uL (ref 3.70–5.32)
RDW: 13.6 % (ref 11.1–15.7)
WBC: 4.7 10*3/uL (ref 3.9–10.0)

## 2014-02-20 LAB — CEA: CEA: 1.4 ng/mL (ref 0.0–5.0)

## 2014-02-21 ENCOUNTER — Telehealth: Payer: Self-pay | Admitting: *Deleted

## 2014-02-21 NOTE — Telephone Encounter (Signed)
Message copied by Rico Ala on Thu Feb 21, 2014  9:35 AM ------      Message from: Burney Gauze R      Created: Thu Feb 21, 2014  7:11 AM       Call 0 labs and tumor marker are normal!!  Mahalo!!!  Have a great time in [place]!! pete ------

## 2014-02-21 NOTE — Telephone Encounter (Signed)
Left message on personal cell phone machine

## 2014-02-21 NOTE — Progress Notes (Signed)
Hematology and Oncology Follow Up Visit  Lindsay Johnson [id number] 04-15-1961 53 y.o. 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  Current Therapy:    Observation     Interim History:  Ms.  Lindsay Johnson is back for her yearly followup. She, as always, got up to [place] over the summertime. They have a cottage up in [place]. She had a good time while she was up there. Her kids are doing well.  She and her husband will be going to [place] in October.  She is still teaching part-time. She enjoys this.  Her health has been good. She related has had no complaints. She's trying to lose some weight. She's had no problems with nausea or vomiting. She's had no cough. She's had no change in bowel or bladder habits. She's had no bleeding. There's been no leg swelling. Her last mammogram was back in January.  Medications: Current outpatient prescriptions:Pentosan Polysulfate Sodium (ELMIRON PO), Take by mouth every morning., Disp: , Rfl: ;  sertraline (ZOLOFT) 100 MG tablet, 100 mg daily. Takes 1 1/2 =150 mg daily, Disp: , Rfl: ;  VITAMIN D, CHOLECALCIFEROL, PO, Take 4,000 Units by mouth every morning., Disp: , Rfl: ;  zolpidem (AMBIEN) 10 MG tablet, Take 5 mg by mouth as needed. , Disp: , Rfl:   Allergies:  Allergies  Allergen Reactions  . Codeine     Makes Pt very "hyper"  . Sulfa Antibiotics     rash    Past Medical History, Surgical history, Social history, and Family History were reviewed and updated.  Review of Systems:  as above  Physical Exam:  height is 5\' 4"  (1.626 m) and weight is 168 lb (76.204 kg). Her oral temperature is 98.2 F (36.8 C). Her blood pressure is 117/67 and her pulse is 58. Her respiration is 14.   Well-developed and well-nourished white female in no obvious distress. Head and neck exam shows no ocular or oral lesions. There are no palpable cervical or supraclavicular lymph nodes. Lungs are clear. Cardiac exam  regular in rhythm with no murmurs rubs or bruits. Abdomen is soft. She has good bowel sounds. There is no fluid wave. There is no palpable liver or spleen tip. She is a well-healed laparotomy scar. Back exam no tenderness over the spine ribs or hips. Extremities shows no clubbing cyanosis or edema. Neurological exam shows no focal neurological deficits. Skin exam no rashes, ecchymoses or petechia.  Lab Results  Component Value Date   WBC 4.7 02/20/2014   HGB 12.7 02/20/2014   HCT 38.2 02/20/2014   MCV 87 02/20/2014   PLT 163 02/20/2014     Chemistry      Component Value Date/Time   NA 143 02/20/2014 0959   NA 139 03/03/2011 1314   K 3.9 02/20/2014 0959   K 3.7 03/03/2011 1314   CL 103 02/20/2014 0959   CL 105 03/03/2011 1314   CO2 28 02/20/2014 0959   CO2 24 03/03/2011 1314   BUN 14 02/20/2014 0959   BUN 13 03/03/2011 1314   CREATININE 0.7 02/20/2014 0959   CREATININE 0.62 03/03/2011 1314      Component Value Date/Time   CALCIUM 9.4 02/20/2014 0959   CALCIUM 9.3 03/03/2011 1314   ALKPHOS 77 02/20/2014 0959   ALKPHOS 69 03/03/2011 1314   AST 19 02/20/2014 0959   AST 15 03/03/2011 1314   ALT 6* 02/20/2014 0959   ALT 15 03/03/2011  1314   BILITOT 0.70 02/20/2014 0959   BILITOT 0.5 03/03/2011 1314      CEA is 1.4.   Impression and Plan: Ms. Lindsay Johnson is very nice 53 year old white female. She has a history of stage II adenocarcinoma of the sigmoid colon. She did undergo adjuvant chemotherapy. Completed this 10 years ago in June of 2005. She received FOLFOX.  She is cured. She still likes to come back to see [place] yearly. I certainly have no problems with this.  [name], MD 8/20/20156:24 PM

## 2014-02-25 ENCOUNTER — Telehealth: Payer: Self-pay | Admitting: Hematology & Oncology

## 2014-02-25 NOTE — Telephone Encounter (Signed)
Mailed 02-2015 schedule

## 2014-11-19 ENCOUNTER — Telehealth: Payer: Self-pay | Admitting: Hematology & Oncology

## 2014-11-19 NOTE — Telephone Encounter (Signed)
Patient called and cx 02/26/15 apt and reesch for 03/18/15

## 2015-01-13 ENCOUNTER — Telehealth: Payer: Self-pay | Admitting: Hematology & Oncology

## 2015-01-13 NOTE — Telephone Encounter (Signed)
Pt call in and requested 11/10 appt date and time

## 2015-02-26 ENCOUNTER — Ambulatory Visit: Payer: 59 | Admitting: Hematology & Oncology

## 2015-02-26 ENCOUNTER — Other Ambulatory Visit: Payer: 59

## 2015-03-18 ENCOUNTER — Other Ambulatory Visit: Payer: Self-pay

## 2015-03-18 ENCOUNTER — Ambulatory Visit: Payer: Self-pay | Admitting: Hematology & Oncology

## 2015-05-14 ENCOUNTER — Other Ambulatory Visit: Payer: Self-pay | Admitting: *Deleted

## 2015-05-14 DIAGNOSIS — Z9189 Other specified personal risk factors, not elsewhere classified: Secondary | ICD-10-CM

## 2015-05-15 ENCOUNTER — Other Ambulatory Visit (HOSPITAL_BASED_OUTPATIENT_CLINIC_OR_DEPARTMENT_OTHER): Payer: BLUE CROSS/BLUE SHIELD

## 2015-05-15 ENCOUNTER — Ambulatory Visit (HOSPITAL_BASED_OUTPATIENT_CLINIC_OR_DEPARTMENT_OTHER): Payer: BLUE CROSS/BLUE SHIELD | Admitting: Hematology & Oncology

## 2015-05-15 VITALS — BP 128/76 | HR 73 | Temp 98.2°F | Resp 20 | Wt 178.0 lb

## 2015-05-15 DIAGNOSIS — E7800 Pure hypercholesterolemia, unspecified: Secondary | ICD-10-CM | POA: Insufficient documentation

## 2015-05-15 DIAGNOSIS — H6980 Other specified disorders of Eustachian tube, unspecified ear: Secondary | ICD-10-CM | POA: Insufficient documentation

## 2015-05-15 DIAGNOSIS — Z9189 Other specified personal risk factors, not elsewhere classified: Secondary | ICD-10-CM

## 2015-05-15 DIAGNOSIS — Z85038 Personal history of other malignant neoplasm of large intestine: Secondary | ICD-10-CM | POA: Diagnosis not present

## 2015-05-15 DIAGNOSIS — C189 Malignant neoplasm of colon, unspecified: Secondary | ICD-10-CM

## 2015-05-15 DIAGNOSIS — G4700 Insomnia, unspecified: Secondary | ICD-10-CM | POA: Insufficient documentation

## 2015-05-15 DIAGNOSIS — M818 Other osteoporosis without current pathological fracture: Secondary | ICD-10-CM

## 2015-05-15 DIAGNOSIS — E785 Hyperlipidemia, unspecified: Secondary | ICD-10-CM | POA: Insufficient documentation

## 2015-05-15 DIAGNOSIS — N3010 Interstitial cystitis (chronic) without hematuria: Secondary | ICD-10-CM | POA: Insufficient documentation

## 2015-05-15 DIAGNOSIS — Z85048 Personal history of other malignant neoplasm of rectum, rectosigmoid junction, and anus: Secondary | ICD-10-CM | POA: Insufficient documentation

## 2015-05-15 DIAGNOSIS — R55 Syncope and collapse: Secondary | ICD-10-CM | POA: Insufficient documentation

## 2015-05-15 LAB — COMPREHENSIVE METABOLIC PANEL (CC13)
ALT: 18 U/L (ref 0–55)
AST: 16 U/L (ref 5–34)
Albumin: 4.3 g/dL (ref 3.5–5.0)
Alkaline Phosphatase: 79 U/L (ref 40–150)
Anion Gap: 10 mEq/L (ref 3–11)
BUN: 17 mg/dL (ref 7.0–26.0)
CALCIUM: 9.9 mg/dL (ref 8.4–10.4)
CHLORIDE: 107 meq/L (ref 98–109)
CO2: 24 mEq/L (ref 22–29)
CREATININE: 0.7 mg/dL (ref 0.6–1.1)
EGFR: >90 mL/min/{1.73_m2} (ref >90)
GLUCOSE: 113 mg/dL (ref 70–140)
Potassium: 4.1 mEq/L (ref 3.5–5.1)
SODIUM: 141 meq/L (ref 136–145)
Total Bilirubin: 0.77 mg/dL (ref 0.20–1.20)
Total Protein: 6.8 g/dL (ref 6.4–8.3)

## 2015-05-15 LAB — CBC WITH DIFFERENTIAL (CANCER CENTER ONLY)
BASO#: 0 10*3/uL (ref 0.0–0.2)
BASO%: 0.4 % (ref 0.0–2.0)
EOS%: 1.1 % (ref 0.0–7.0)
Eosinophils Absolute: 0.1 10*3/uL (ref 0.0–0.5)
HCT: 38.6 % (ref 34.8–46.6)
HGB: 12.8 g/dL (ref 11.6–15.9)
LYMPH#: 1.4 10*3/uL (ref 0.9–3.3)
LYMPH%: 24.7 % (ref 14.0–48.0)
MCH: 29.4 pg (ref 26.0–34.0)
MCHC: 33.2 g/dL (ref 32.0–36.0)
MCV: 89 fL (ref 81–101)
MONO#: 0.4 10*3/uL (ref 0.1–0.9)
MONO%: 6.9 % (ref 0.0–13.0)
NEUT%: 66.9 % (ref 39.6–80.0)
NEUTROS ABS: 3.8 10*3/uL (ref 1.5–6.5)
PLATELETS: 175 10*3/uL (ref 145–400)
RBC: 4.36 10*6/uL (ref 3.70–5.32)
RDW: 12.4 % (ref 11.1–15.7)
WBC: 5.6 10*3/uL (ref 3.9–10.0)

## 2015-05-15 NOTE — Progress Notes (Signed)
Hematology and Oncology Follow Up Visit  Lindsay Johnson [id number] [birth year] 54 y.o. 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 We see her yearly. It now has been 12 years since she had treatment for her colon cancer. She likes come back to see [place] for follow-up.  We last saw her about a year or so ago. Since then, she has been known pretty well. She is retired. Her husband lost his job about a year ago and is trying to find work. It sounds like they may end up moving. Their 2 kids are doing well.  She has not had any problems with her bowels or bladder. Have her last mammogram was I think earlier this year. Everything turned out okay.  Of note, she did have genetic testing for her risk of colon cancer. So far, the genetic test have been negative for any specific gene mutation.  She and her husband were out at their [place] in [place] all summer. They just got back about a week ago.  She goes to the dermatologist yearly.  She's had no issues with rashes. His been no leg swelling. Per as she is on Lipitor at 10 mg a day. She is doing well with this without arthralgias.  Overall, her performance status is ECOG 0.  Medications:  Current outpatient prescriptions:  .  atorvastatin (LIPITOR) 20 MG tablet, Take 10 mg by mouth daily. Breaks 20mg  tab in 1/2., Disp: , Rfl:  .  Cyanocobalamin (RA VITAMIN B-12 TR) 1000 MCG TBCR, Take 1,000 mcg by mouth daily., Disp: , Rfl:  .  fluconazole (DIFLUCAN) 100 MG tablet, Take 100 mg by mouth as needed., Disp: , Rfl:  .  pentosan polysulfate (ELMIRON) 100 MG capsule, Take 100 mg by mouth daily., Disp: , Rfl:  .  scopolamine (TRANSDERM-SCOP, 1.5 MG,) 1 MG/3DAYS, Place 1 patch onto the skin., Disp: , Rfl:  .  sertraline (ZOLOFT) 100 MG tablet, 100 mg daily. Takes 1 1/2 =150 mg daily, Disp: , Rfl:  .   VITAMIN D, CHOLECALCIFEROL, PO, Take 4,000 Units by mouth every morning., Disp: , Rfl:  .  zolpidem (AMBIEN) 10 MG tablet, Take 5 mg by mouth as needed. , Disp: , Rfl:   Allergies:  Allergies  Allergen Reactions  . Codeine     Makes Pt very "hyper"  . Sulfa Antibiotics     rash    Past Medical History, Surgical history, Social history, and Family History were reviewed and updated.  Review of Sysas above  Physical Exam:  weight is 178 lb (80.74 kg). Her oral temperature is 98.2 F (36.8 C). Her blood pressure is 128/76 and her pulse is 73. Her respiration is 20.   Wt Readings from Last 3 Encounters:  05/15/15 178 lb (80.74 kg)  02/20/14 168 lb (76.204 kg)  02/21/13 174 lb (78.926 kg)      well-developed and well-nourished white female in no obvious distress. Head and neck exam shows no ocular or oral lesions. There are no palpable cervical or supraclavicular lymph nodes. Lungs are clear bilaterally. Cardiac exam regular rate and rhythm with no murmurs, rubs or bruits. Abdomen is soft. She has good bowel sounds. She has well-healed laparotomy scar. She has no guarding or rebound tenderness. She has no palpable hepatosplenomegaly. Back exam shows no tenderness over the spine,  ribs or hips. Extremities shows no clubbing, cyanosis or edema. Neurological exam shows no focal neurological deficit. Skin exam shows no rashes, ecchymoses or petechia.  Lab Results  Component Value Date   WBC 5.6 05/15/2015   HGB 12.8 05/15/2015   HCT 38.6 05/15/2015   MCV 89 05/15/2015   PLT 175 05/15/2015     Chemistry      Component Value Date/Time   NA 141 05/15/2015 1026   NA 143 02/20/2014 0959   NA 139 03/03/2011 1314   K 4.1 05/15/2015 1026   K 3.9 02/20/2014 0959   K 3.7 03/03/2011 1314   CL 103 02/20/2014 0959   CL 105 03/03/2011 1314   CO2 24 05/15/2015 1026   CO2 28 02/20/2014 0959   CO2 24 03/03/2011 1314   BUN 17.0 05/15/2015 1026   BUN 14 02/20/2014 0959   BUN 13 03/03/2011 1314    CREATININE 0.7 05/15/2015 1026   CREATININE 0.7 02/20/2014 0959   CREATININE 0.62 03/03/2011 1314      Component Value Date/Time   CALCIUM 9.9 05/15/2015 1026   CALCIUM 9.4 02/20/2014 0959   CALCIUM 9.3 03/03/2011 1314   ALKPHOS 79 05/15/2015 1026   ALKPHOS 77 02/20/2014 0959   ALKPHOS 69 03/03/2011 1314   AST 16 05/15/2015 1026   AST 19 02/20/2014 0959   AST 15 03/03/2011 1314   ALT 18 05/15/2015 1026   ALT 6* 02/20/2014 0959   ALT 15 03/03/2011 1314   BILITOT 0.77 05/15/2015 1026   BILITOT 0.70 02/20/2014 0959   BILITOT 0.5 03/03/2011 1314         Impression and Plan: Lindsay Johnson is a 54 year old white female. She had stage II invasive colon cancer 12 years ago. She underwent resection. She underwent adjuvant chemotherapy with FOLFOX.  She's done very well. There's been no evidence of recurrence.  She likes to come back to see [place] he. I will continue to see her back yearly. Again, it gives her peace of mind that she is being followed by oncology.   [name], MD 11/10/20161:39 PM

## 2015-05-16 LAB — CEA: CEA: 1 ng/mL (ref 0.0–5.0)

## 2015-07-06 HISTORY — PX: COLONOSCOPY: SHX174

## 2015-07-12 DIAGNOSIS — M545 Low back pain, unspecified: Secondary | ICD-10-CM | POA: Insufficient documentation

## 2016-05-13 ENCOUNTER — Other Ambulatory Visit: Payer: BLUE CROSS/BLUE SHIELD

## 2016-05-13 ENCOUNTER — Other Ambulatory Visit (HOSPITAL_BASED_OUTPATIENT_CLINIC_OR_DEPARTMENT_OTHER): Payer: BLUE CROSS/BLUE SHIELD

## 2016-05-13 ENCOUNTER — Ambulatory Visit: Payer: BLUE CROSS/BLUE SHIELD | Admitting: Hematology & Oncology

## 2016-05-13 ENCOUNTER — Ambulatory Visit (HOSPITAL_BASED_OUTPATIENT_CLINIC_OR_DEPARTMENT_OTHER): Payer: BLUE CROSS/BLUE SHIELD | Admitting: Hematology & Oncology

## 2016-05-13 VITALS — BP 122/74 | HR 79 | Temp 98.2°F | Resp 18 | Wt 185.4 lb

## 2016-05-13 DIAGNOSIS — M818 Other osteoporosis without current pathological fracture: Secondary | ICD-10-CM

## 2016-05-13 DIAGNOSIS — R635 Abnormal weight gain: Secondary | ICD-10-CM | POA: Diagnosis not present

## 2016-05-13 DIAGNOSIS — Z9189 Other specified personal risk factors, not elsewhere classified: Secondary | ICD-10-CM

## 2016-05-13 DIAGNOSIS — Z85038 Personal history of other malignant neoplasm of large intestine: Secondary | ICD-10-CM

## 2016-05-13 DIAGNOSIS — C189 Malignant neoplasm of colon, unspecified: Secondary | ICD-10-CM

## 2016-05-13 LAB — CBC WITH DIFFERENTIAL (CANCER CENTER ONLY)
BASO#: 0 10*3/uL (ref 0.0–0.2)
BASO%: 0.5 % (ref 0.0–2.0)
EOS ABS: 0.1 10*3/uL (ref 0.0–0.5)
EOS%: 1.7 % (ref 0.0–7.0)
HCT: 35.5 % (ref 34.8–46.6)
HEMOGLOBIN: 12.2 g/dL (ref 11.6–15.9)
LYMPH#: 1.4 10*3/uL (ref 0.9–3.3)
LYMPH%: 23.6 % (ref 14.0–48.0)
MCH: 29.5 pg (ref 26.0–34.0)
MCHC: 34.4 g/dL (ref 32.0–36.0)
MCV: 86 fL (ref 81–101)
MONO#: 0.4 10*3/uL (ref 0.1–0.9)
MONO%: 7.2 % (ref 0.0–13.0)
NEUT%: 67 % (ref 39.6–80.0)
NEUTROS ABS: 3.8 10*3/uL (ref 1.5–6.5)
PLATELETS: 170 10*3/uL (ref 145–400)
RBC: 4.13 10*6/uL (ref 3.70–5.32)
RDW: 12.5 % (ref 11.1–15.7)
WBC: 5.7 10*3/uL (ref 3.9–10.0)

## 2016-05-13 LAB — COMPREHENSIVE METABOLIC PANEL
ALBUMIN: 4 g/dL (ref 3.5–5.0)
ALK PHOS: 81 U/L (ref 40–150)
ALT: 20 U/L (ref 0–55)
AST: 16 U/L (ref 5–34)
Anion Gap: 11 mEq/L (ref 3–11)
BILIRUBIN TOTAL: 0.54 mg/dL (ref 0.20–1.20)
BUN: 17.2 mg/dL (ref 7.0–26.0)
CO2: 22 meq/L (ref 22–29)
Calcium: 9.2 mg/dL (ref 8.4–10.4)
Chloride: 107 mEq/L (ref 98–109)
Creatinine: 0.7 mg/dL (ref 0.6–1.1)
GLUCOSE: 114 mg/dL (ref 70–140)
Potassium: 3.7 mEq/L (ref 3.5–5.1)
SODIUM: 140 meq/L (ref 136–145)
TOTAL PROTEIN: 6.8 g/dL (ref 6.4–8.3)

## 2016-05-13 NOTE — Progress Notes (Signed)
Hematology and Oncology Follow Up Visit  Lindsay Johnson [id number] [birth year] 55 y.o. 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 We see her yearly. It now has been 13 years since she had treatment for her colon cancer. She likes come back to see [place] for follow-up.  She is doing okay. As always, she was up in [place] for the summer.  Her son is working in [place]. Her daughter is working down in [place].  She is working. She is not too keen on her job but realizes that she has to keep working right now.  She is gaining weight. I do not like the fact that she is gaining weight. She's trying to exercise. She's trying to watch what she eats.  She's had no problems with cough or shortness of breath. She's had no change in bowel or bladder habits.   She had her mammogram done this year. Everything came out okay.   She's had no leg swelling. She's had a couple small bruises on her legs. Patient does have proximal with cystitis. She is on a medication for this. This medication does increase the incidence of bruising.   Overall, her performance status is ECOG 0.  Medications:  Current Outpatient Prescriptions:  .  ranitidine (ZANTAC) 150 MG capsule, Take 150 mg by mouth., Disp: , Rfl:  .  atorvastatin (LIPITOR) 20 MG tablet, Take 10 mg by mouth daily. Breaks 20mg  tab in 1/2., Disp: , Rfl:  .  Cholecalciferol (VITAMIN D-1000 MAX ST) 1000 units tablet, Take by mouth., Disp: , Rfl:  .  Cyanocobalamin (RA VITAMIN B-12 TR) 1000 MCG TBCR, Take 1,000 mcg by mouth daily., Disp: , Rfl:  .  pentosan polysulfate (ELMIRON) 100 MG capsule, Take 100 mg by mouth daily., Disp: , Rfl:  .  scopolamine (TRANSDERM-SCOP, 1.5 MG,) 1 MG/3DAYS, Place 1 patch onto the skin., Disp: , Rfl:  .  sertraline (ZOLOFT) 100 MG tablet, 100 mg daily. Takes 1 1/2 =150  mg daily, Disp: , Rfl:  .  zolpidem (AMBIEN) 10 MG tablet, Take 5 mg by mouth as needed. , Disp: , Rfl:   Allergies:  Allergies  Allergen Reactions  . Codeine     Makes Pt very "hyper"  . Sulfa Antibiotics     rash    Past Medical History, Surgical history, Social history, and Family History were reviewed and updated.  Review of Sysas above  Physical Exam:  weight is 185 lb 6.4 oz (84.1 kg). Her oral temperature is 98.2 F (36.8 C). Her blood pressure is 122/74 and her pulse is 79. Her respiration is 18.   Wt Readings from Last 3 Encounters:  05/13/16 185 lb 6.4 oz (84.1 kg)  05/15/15 178 lb (80.7 kg)  02/20/14 168 lb (76.2 kg)      well-developed and well-nourished white female in no obvious distress. Head and neck exam shows no ocular or oral lesions. There are no palpable cervical or supraclavicular lymph nodes. Lungs are clear bilaterally. Cardiac exam regular rate and rhythm with no murmurs, rubs or bruits. Abdomen is soft. She has good bowel sounds. She has well-healed laparotomy scar. She has no guarding or rebound tenderness. She has no palpable hepatosplenomegaly. Back exam shows no tenderness over the spine, ribs or hips. Extremities shows no clubbing, cyanosis or edema. Neurological exam shows  no focal neurological deficit. Skin exam shows no rashes, ecchymoses or petechia.  Lab Results  Component Value Date   WBC 5.7 05/13/2016   HGB 12.2 05/13/2016   HCT 35.5 05/13/2016   MCV 86 05/13/2016   PLT 170 05/13/2016     Chemistry      Component Value Date/Time   NA 141 05/15/2015 1026   K 4.1 05/15/2015 1026   CL 103 02/20/2014 0959   CO2 24 05/15/2015 1026   BUN 17.0 05/15/2015 1026   CREATININE 0.7 05/15/2015 1026      Component Value Date/Time   CALCIUM 9.9 05/15/2015 1026   ALKPHOS 79 05/15/2015 1026   AST 16 05/15/2015 1026   ALT 18 05/15/2015 1026   BILITOT 0.77 05/15/2015 1026         Impression and Plan: Lindsay Johnson is a 55 year old white  female. She had stage II invasive colon cancer 13 years ago. She underwent resection. She underwent adjuvant chemotherapy with FOLFOX.  She's done very well. There's been no evidence of recurrence.  She likes to come back to see [place] he. I will continue to see her back yearly. Again, it gives her peace of mind that she is being followed by oncology.   [name], MD 11/9/20171:38 PM

## 2016-05-14 ENCOUNTER — Encounter: Payer: Self-pay | Admitting: *Deleted

## 2016-05-14 LAB — VITAMIN D 25 HYDROXY (VIT D DEFICIENCY, FRACTURES): Vitamin D, 25-Hydroxy: 31.9 ng/mL (ref 30.0–100.0)

## 2016-05-14 LAB — CEA (IN HOUSE-CHCC): CEA (CHCC-IN HOUSE): 1.33 ng/mL (ref 0.00–5.00)

## 2016-05-14 LAB — CEA (PARALLEL TESTING): CEA: 0.5 ng/mL

## 2016-05-17 ENCOUNTER — Encounter: Payer: Self-pay | Admitting: Hematology & Oncology

## 2016-08-20 DIAGNOSIS — R251 Tremor, unspecified: Secondary | ICD-10-CM | POA: Insufficient documentation

## 2016-08-23 DIAGNOSIS — R41840 Attention and concentration deficit: Secondary | ICD-10-CM | POA: Insufficient documentation

## 2016-08-23 DIAGNOSIS — R42 Dizziness and giddiness: Secondary | ICD-10-CM | POA: Insufficient documentation

## 2016-09-03 ENCOUNTER — Other Ambulatory Visit: Payer: Self-pay | Admitting: Gastroenterology

## 2017-05-05 ENCOUNTER — Encounter: Payer: Self-pay | Admitting: Hematology & Oncology

## 2017-05-09 ENCOUNTER — Other Ambulatory Visit (HOSPITAL_BASED_OUTPATIENT_CLINIC_OR_DEPARTMENT_OTHER): Payer: PRIVATE HEALTH INSURANCE

## 2017-05-09 ENCOUNTER — Ambulatory Visit (HOSPITAL_BASED_OUTPATIENT_CLINIC_OR_DEPARTMENT_OTHER): Payer: PRIVATE HEALTH INSURANCE | Admitting: Hematology & Oncology

## 2017-05-09 VITALS — BP 119/72 | HR 65 | Temp 97.6°F | Resp 18 | Wt 185.8 lb

## 2017-05-09 DIAGNOSIS — R5383 Other fatigue: Secondary | ICD-10-CM | POA: Diagnosis not present

## 2017-05-09 DIAGNOSIS — Z85038 Personal history of other malignant neoplasm of large intestine: Secondary | ICD-10-CM | POA: Diagnosis not present

## 2017-05-09 DIAGNOSIS — D500 Iron deficiency anemia secondary to blood loss (chronic): Secondary | ICD-10-CM

## 2017-05-09 DIAGNOSIS — N951 Menopausal and female climacteric states: Secondary | ICD-10-CM

## 2017-05-09 DIAGNOSIS — C185 Malignant neoplasm of splenic flexure: Secondary | ICD-10-CM

## 2017-05-09 DIAGNOSIS — D510 Vitamin B12 deficiency anemia due to intrinsic factor deficiency: Secondary | ICD-10-CM

## 2017-05-09 DIAGNOSIS — Z9189 Other specified personal risk factors, not elsewhere classified: Secondary | ICD-10-CM

## 2017-05-09 LAB — CBC WITH DIFFERENTIAL (CANCER CENTER ONLY)
BASO#: 0 10*3/uL (ref 0.0–0.2)
BASO%: 0.4 % (ref 0.0–2.0)
EOS%: 1.5 % (ref 0.0–7.0)
Eosinophils Absolute: 0.1 10*3/uL (ref 0.0–0.5)
HCT: 35.6 % (ref 34.8–46.6)
HGB: 12.1 g/dL (ref 11.6–15.9)
LYMPH#: 1.5 10*3/uL (ref 0.9–3.3)
LYMPH%: 26.5 % (ref 14.0–48.0)
MCH: 29.7 pg (ref 26.0–34.0)
MCHC: 34 g/dL (ref 32.0–36.0)
MCV: 88 fL (ref 81–101)
MONO#: 0.4 10*3/uL (ref 0.1–0.9)
MONO%: 7.7 % (ref 0.0–13.0)
NEUT#: 3.5 10*3/uL (ref 1.5–6.5)
NEUT%: 63.9 % (ref 39.6–80.0)
PLATELETS: 170 10*3/uL (ref 145–400)
RBC: 4.07 10*6/uL (ref 3.70–5.32)
RDW: 12.4 % (ref 11.1–15.7)
WBC: 5.5 10*3/uL (ref 3.9–10.0)

## 2017-05-09 LAB — CMP (CANCER CENTER ONLY)
ALK PHOS: 64 U/L (ref 26–84)
ALT: 24 U/L (ref 10–47)
AST: 19 U/L (ref 11–38)
Albumin: 4 g/dL (ref 3.3–5.5)
BUN: 17 mg/dL (ref 7–22)
CHLORIDE: 104 meq/L (ref 98–108)
CO2: 28 meq/L (ref 18–33)
Calcium: 9.7 mg/dL (ref 8.0–10.3)
Creat: 0.9 mg/dl (ref 0.6–1.2)
GLUCOSE: 103 mg/dL (ref 73–118)
POTASSIUM: 3.7 meq/L (ref 3.3–4.7)
Sodium: 143 mEq/L (ref 128–145)
Total Bilirubin: 0.8 mg/dl (ref 0.20–1.60)
Total Protein: 6.5 g/dL (ref 6.4–8.1)

## 2017-05-09 NOTE — Progress Notes (Signed)
Hematology and Oncology Follow Up Visit  AMORITA VANROSSUM [id number] September 21, 1960 56 y.o. 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 Lippens is back for follow-up.  I am not sure exactly what is going on but she is having some issues.  She has been seen by several doctors.  Mostly, her family doctor is trying to help out.  She just feels tired.  She does not have a lot of energy.  She has a "brain fog".  She is still working.  She is not too happy at work.  She has been tested at [facility].  She had a chest x-ray.  This was unremarkable.  She is worried about her cancer coming back.  It is now been 14 years since she had treatment for her cancer.  There is no pain in the hands or feet.  She has no neuropathy.  She is not lost her hair.  She has had a positive ANA.  I am not sure what this really means.  I am sure her family doctor can help out with this.   She has had her mammograms.  She has had her female exams.  Everything has been okay.  I try to reassure her that I just did not think that anything was happening with respect to her cancer.    She has not gained weight.  She has not lost weight.  I do not think she is really exercising.  She has had no leg edema.  She has had no rashes.  She has had no fever.  Overall, her performance status is ECOG 1.    Medications:  Current Outpatient Medications:  .  atorvastatin (LIPITOR) 20 MG tablet, Take 10 mg by mouth daily. Breaks 20mg  tab in 1/2., Disp: , Rfl:  .  Calcium Glycerophosphate (PRELIEF PO), Take daily by mouth., Disp: , Rfl:  .  cholecalciferol (VITAMIN D) 1000 units tablet, Take 1,000 Units daily by mouth. 2000 units daily, Disp: , Rfl:  .  cyanocobalamin 1000 MCG tablet, Take 1,000 mcg daily by mouth., Disp: , Rfl:  .  FLUoxetine (PROZAC) 20 MG tablet, Take 20 mg daily by mouth., Disp: , Rfl:  .  pentosan  polysulfate (ELMIRON) 100 MG capsule, Take 100 mg by mouth daily., Disp: , Rfl:  .  propranolol (INDERAL) 20 MG tablet, Take 20 mg 2 (two) times daily by mouth., Disp: , Rfl:  .  QUERCETIN PO, Take daily by mouth., Disp: , Rfl:  .  ranitidine (ZANTAC) 150 MG capsule, Take 150 mg 2 (two) times daily by mouth. , Disp: , Rfl:   Allergies:  Allergies  Allergen Reactions  . Codeine     Makes Pt very "hyper"  . Sulfa Antibiotics     rash    Past Medical History, Surgical history, Social history, and Family History were reviewed and updated.  Review of System:   As stated in the interim history  Physical Exam:  weight is 185 lb 12 oz (84.3 kg). Her oral temperature is 97.6 F (36.4 C). Her blood pressure is 119/72 and her pulse is 65. Her respiration is 18.   Wt Readings from Last 3 Encounters:  05/09/17 185 lb 12 oz (84.3 kg)  05/13/16 185 lb 6.4 oz (84.1 kg)  05/15/15 178 lb (80.7 kg)     Well-developed and well-nourished white female in no obvious  distress.  Head and neck exam shows no ocular or oral lesions.  There are no palpable cervical or supraclavicular lymph nodes.  Thyroid is nonpalpable.  Lungs are clear bilaterally.  Cardiac exam regular rate and rhythm with no murmurs, rubs or bruits.  Abdomen is soft.  She has well-healed laparoscopy scar.  There is no fluid wave.  There is no palpable liver or spleen tip.  Back exam shows no tenderness over the spine, ribs or hips.  Extremities shows no clubbing, cyanosis or edema.  She has good range of motion of her joints.  Skin exam shows no rashes, ecchymoses or petechia.  Lab Results  Component Value Date   WBC 5.5 05/09/2017   HGB 12.1 05/09/2017   HCT 35.6 05/09/2017   MCV 88 05/09/2017   PLT 170 05/09/2017     Chemistry      Component Value Date/Time   NA 143 05/09/2017 1343   NA 140 05/13/2016 1256   K 3.7 05/09/2017 1343   K 3.7 05/13/2016 1256   CL 104 05/09/2017 1343   CO2 28 05/09/2017 1343   CO2 22 05/13/2016  1256   BUN 17 05/09/2017 1343   BUN 17.2 05/13/2016 1256   CREATININE 0.9 05/09/2017 1343   CREATININE 0.7 05/13/2016 1256      Component Value Date/Time   CALCIUM 9.7 05/09/2017 1343   CALCIUM 9.2 05/13/2016 1256   ALKPHOS 64 05/09/2017 1343   ALKPHOS 81 05/13/2016 1256   AST 19 05/09/2017 1343   AST 16 05/13/2016 1256   ALT 24 05/09/2017 1343   ALT 20 05/13/2016 1256   BILITOT 0.80 05/09/2017 1343   BILITOT 0.54 05/13/2016 1256         Impression and Plan: Ms. Kimm is a 56 year old white female. She had stage II invasive colon cancer 13 years ago. She underwent resection. She underwent adjuvant chemotherapy with FOLFOX.  I am a little puzzled as to what might be going on.  She has had very extensive testing.  I do not think that she needs any scans.  I am not sure what a CT scan could really pick up.  I will check her iron levels.  I will send off a vitamin B12 on her.  I will also send off an estradiol.  It is possible that she may be very low in estradiol and that an estrogen patch might make her feel better.  If her iron levels are low, then we will give her some IV iron.  I spent about 40 minutes with her.  She just feels confident in my office and the fact that we will try and help her out.  I will plan to get her back in 3 months.  We will see how she is feeling.   [name], MD 11/5/20182:37 PM

## 2017-05-10 LAB — VITAMIN B12: Vitamin B12: 941 pg/mL (ref 232–1245)

## 2017-05-10 LAB — ANTINUCLEAR ANTIBODIES, IFA: ANA Titer 1: POSITIVE — AB

## 2017-05-10 LAB — IRON AND TIBC
%SAT: 27 % (ref 21–57)
Iron: 88 ug/dL (ref 41–142)
TIBC: 328 ug/dL (ref 236–444)
UIBC: 240 ug/dL (ref 120–384)

## 2017-05-10 LAB — CEA (IN HOUSE-CHCC): CEA (CHCC-In House): 1.71 ng/mL (ref 0.00–5.00)

## 2017-05-10 LAB — FERRITIN: FERRITIN: 127 ng/mL (ref 9–269)

## 2017-05-10 LAB — TSH: TSH: 0.901 m[IU]/L (ref 0.308–3.960)

## 2017-05-11 LAB — ESTRADIOL, ULTRA SENS: ESTRADIOL, SENSITIVE: 2.8 pg/mL

## 2017-05-12 ENCOUNTER — Encounter: Payer: Self-pay | Admitting: Hematology & Oncology

## 2017-05-12 ENCOUNTER — Ambulatory Visit: Payer: BLUE CROSS/BLUE SHIELD | Admitting: Hematology & Oncology

## 2017-05-12 ENCOUNTER — Other Ambulatory Visit: Payer: BLUE CROSS/BLUE SHIELD

## 2017-07-04 ENCOUNTER — Encounter: Payer: Self-pay | Admitting: Hematology & Oncology

## 2017-07-12 ENCOUNTER — Encounter: Payer: Self-pay | Admitting: Neurology

## 2017-07-25 ENCOUNTER — Encounter: Payer: Self-pay | Admitting: Neurology

## 2017-07-26 NOTE — Progress Notes (Signed)
Subjective:   Lindsay Johnson was seen in consultation in the movement disorder clinic at the request of Dr. Martha Johnson.  Her PCP is Lindsay Banner, MD.  The evaluation is for tremor and balance change.   The records that were made available to me were reviewed.  She has seen 2 other neurologists for the same.  Pt saw Dr. Linus Johnson in 08/2016.  He thought she possibly had a essential tremor, but given her complaints to him of dizziness and cognitive change, he did an MRI of the brain.  This was completed in March, 2018.  I only have the reports, but not the films.  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  He started her on propranolol, 80 mg at follow-up and told her to return in 6 months. She states that she was only started on 20 mg and it helped but then it quit working so she increased to 20 mg bid.  It is not working as well now.  She notes it is lowering her BP but she is asymptommatic with that.   She saw a different neurologist in 05/2017, Dr. Nestor Johnson.  Sleep study was recommended for her complaints of fatigue and cognitive change.  It appears anxiety was also felt to be playing a role.  Repeat MRI was recommended in 6 months to 1 year.  She states that sx's are getting worse.  She cannot walk a straight line.  She feels like she has inner tremor, as well as external tremor.  She has trouble with coordination issues like washing her hair, putting on and off her coat, getting in and out of car and "I feel like I have no muscle strength."  Some paresthesias in all of the fingers on all of the hands.  She admits to neck pain since November.  She has trouble checking her blind spot when driving.  She states that her urologist thinks that neck issues have to do with urinary hesitancy, frequency and urgency.     Tremor started approximately 1 year ago and involves the bilateral UE.  Tremor is most noticeable when using the hands.   There is no family hx of tremor.     Affected by caffeine:  unknown (only drinks decaf coffee) Affected by alcohol:  (2-3 glasses wine/day on weekend and makes tremor and balance worse) Affected by stress:  Yes.   Affected by fatigue:  No. Spills soup if on spoon:  No., but she can feel the tremor Spills glass of liquid if full:  No. Affects ADL's (tying shoes, brushing teeth, etc):  No., but does have some trouble with flossing teeth and with handwriting  Current/Previously tried tremor medications: propranolol  Current medications that may exacerbate tremor:  n/a  Outside reports reviewed: historical medical records, lab reports, office notes and referral letter/letters.  Living situation:  Pt lives with their spouse.  The patient does not do the finances in the home. Husband takes care of finances.   The patient does drive but sometimes she feels that she has a "brain fog" when she drives.  Fatigue got better when she d/c ambien but the brain fog didn't.  The patient does cook.  There is no difficulty remembering common recipes.  The stovetop has rarely been left on accidentally.  ADL's:  The patient is  able to perform her own ADL's. The patient self medicates.The patients bladder and bowel are under good control.   Behavior:  There have been no behavioral changes over the years.  On zoloft and doing well   Allergies  Allergen Reactions  . Codeine     Makes Pt very "hyper"  . Sulfa Antibiotics     rash  . Bupropion Other (See Comments)  . Valium [Diazepam]     hyperactive    Outpatient Encounter Medications as of 07/27/2017  Medication Sig  . atorvastatin (LIPITOR) 20 MG tablet Take 10 mg by mouth daily. Breaks 20mg  tab in 1/2.  Marland Kitchen Calcium Glycerophosphate (PRELIEF PO) Take daily by mouth.  . cholecalciferol (VITAMIN D) 1000 units tablet Take 1,000 Units daily by mouth. 2000 units daily  . co-enzyme Q-10 30 MG capsule Take 30 mg by mouth 3 (three) times daily.  . cyanocobalamin 1000 MCG tablet Take 1,000 mcg  daily by mouth.  . pentosan polysulfate (ELMIRON) 100 MG capsule Take 100 mg by mouth daily.  . propranolol (INDERAL) 20 MG tablet Take 20 mg 2 (two) times daily by mouth.  . sertraline (ZOLOFT) 100 MG tablet Take 150 mg by mouth daily.  . [DISCONTINUED] FLUoxetine (PROZAC) 20 MG tablet Take 20 mg daily by mouth.  . [DISCONTINUED] QUERCETIN PO Take daily by mouth.  . [DISCONTINUED] ranitidine (ZANTAC) 150 MG capsule Take 150 mg 2 (two) times daily by mouth.    No facility-administered encounter medications on file as of 07/27/2017.     Past Medical History:  Diagnosis Date  . Anxiety   . Colon cancer ([place])   . GERD (gastroesophageal reflux disease)   . Hyperlipemia   . Interstitial cystitis   . Tremor     Past Surgical History:  Procedure Laterality Date  . COLECTOMY  2005  . INNER EAR SURGERY     multiple ear surgeries per patient    Social History   Socioeconomic History  . Marital status: Married    Spouse name: Not on file  . Number of children: Not on file  . Years of education: Not on file  . Highest education level: Not on file  Social Needs  . Financial resource strain: Not on file  . Food insecurity - worry: Not on file  . Food insecurity - inability: Not on file  . Transportation needs - medical: Not on file  . Transportation needs - non-medical: Not on file  Occupational History  . Occupation: Print production planner  Tobacco Use  . Smoking status: Never Smoker  . Smokeless tobacco: Never Used  . Tobacco comment: never used tobacco  Substance and Sexual Activity  . Alcohol use: Yes    Comment: wine on weekends   . Drug use: No  . Sexual activity: Not on file  Other Topics Concern  . Not on file  Social History Narrative  . Not on file    Family Status  Relation Name Status  . Mother  Deceased  . Father  Alive  . Sister x2 Alive  . Brother  Alive  . Son  Alive  . Daughter  Alive    Review of Systems Trouble with sleep.  Yells out in dreams per  husband.  A complete 10 system ROS was obtained and was negative apart from what is mentioned.   Objective:   VITALS:   Vitals:   07/27/17 0943  BP: 98/64  Pulse: 68  SpO2: 96%  Weight: 184 lb (83.5 kg)  Height: 5\' 4"  (1.626 m)   Gen:  Appears stated age and in NAD. HEENT:  Normocephalic, atraumatic. The mucous membranes  are moist. The superficial temporal arteries are without ropiness or tenderness. Cardiovascular: Regular rate and rhythm. Lungs: Clear to auscultation bilaterally. Neck: There are no carotid bruits noted bilaterally.  NEUROLOGICAL:  Orientation:  The patient is alert and oriented x 3.  Recent and remote memory are intact.  Attention span and concentration are normal.  Able to name objects and repeat without trouble.  Fund of knowledge is appropriate Cranial nerves: There is good facial symmetry. The pupils are equal round and reactive to light bilaterally. Fundoscopic exam reveals clear disc margins bilaterally. Extraocular muscles are intact and visual fields are full to confrontational testing. Speech is fluent and clear. Soft palate rises symmetrically and there is no tongue deviation. Hearing is intact to conversational tone. Tone: Tone is good throughout. Sensation: Sensation is intact to light touch and pinprick throughout (facial, trunk, extremities). Vibration is intact at the bilateral big toe but slightly decreased compared to hands. There is no extinction with double simultaneous stimulation. There is no sensory dermatomal level identified. Coordination:  The patient has no dysdiadichokinesia or dysmetria. Motor: Strength is 5/5 in the bilateral upper and lower extremities.  Shoulder shrug is equal bilaterally.  There is no pronator drift.  There are no fasciculations noted. DTR's: Deep tendon reflexes are 2+-3/4 at the bilateral biceps, triceps, brachioradialis, patella and achilles.  She does have pectoralis reflexes bilaterally.  No hoffmans or Tromners  responses.    Plantar responses are downgoing bilaterally. Gait and Station: The patient is able to ambulate without difficulty. The patient is able to heel toe walk without any difficulty. The patient is able to ambulate in a tandem fashion with minimal trouble (loses balance a little). The patient is able to stand in the Romberg position.   MOVEMENT EXAM: Tremor:  There is no significant tremor in the UE, with intention or with posture.  The patient is able to draw Archimedes spirals without significant difficulty.  There is no tremor at rest.  The patient is able to pour water from one glass to another without spilling it.  Labs:  The patient has had an extensive number of labs through [facility].  I have reviewed a number of these.  Briefly, her 14 was 75 on March 03, 2017.  In June 22, 2017 her white blood cells were 6.7, hemoglobin 13.1, hematocrit 39.9 and platelets 194.  TSH was 1.687.     Assessment/Plan:   1.  Gait instability with hyperreflexia and neck pain  -We will do MRI of the cervical spine without gad.    -we will repeat her MRI of the brain and add gad this time given hx of colon CA.  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  She asked me about chemo induced PN but I doubt that given that chemo was many years ago and very little evidence of that on PE  -she asked me about lyme disease.  I think that the likelihood is low, especially in this area.  I offered to check.  She had it checked last year and it was negative.  Said that she would defer and have her PCP check at her PE.  2.   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  Could affect cognition.  Encouraged her to schedule sleep study  3.  Tremor  -I didn't see any tremor on her examination.  She stated that she could feel tremor.  It could be that propranolol was covering up tremor but she stated that propranolol wasn't helping it any longer.  I did many  provocative tests and didn't note tremor today.  Wouldn't change propranolol for now  4.  F/u will depend on results of above.  Much greater than 50% of this visit was spent in counseling and coordinating care.  Total face to face time:  60 min  CC:  Lindsay Banner, MD

## 2017-07-27 ENCOUNTER — Ambulatory Visit (INDEPENDENT_AMBULATORY_CARE_PROVIDER_SITE_OTHER): Payer: PRIVATE HEALTH INSURANCE | Admitting: Neurology

## 2017-07-27 ENCOUNTER — Encounter: Payer: Self-pay | Admitting: Neurology

## 2017-07-27 VITALS — BP 98/64 | HR 68 | Ht 64.0 in | Wt 184.0 lb

## 2017-07-27 DIAGNOSIS — Z85038 Personal history of other malignant neoplasm of large intestine: Secondary | ICD-10-CM | POA: Diagnosis not present

## 2017-07-27 DIAGNOSIS — R2681 Unsteadiness on feet: Secondary | ICD-10-CM

## 2017-07-27 DIAGNOSIS — G250 Essential tremor: Secondary | ICD-10-CM | POA: Diagnosis not present

## 2017-07-27 DIAGNOSIS — R292 Abnormal reflex: Secondary | ICD-10-CM

## 2017-07-27 DIAGNOSIS — E348 Other specified endocrine disorders: Secondary | ICD-10-CM | POA: Diagnosis not present

## 2017-07-27 DIAGNOSIS — M542 Cervicalgia: Secondary | ICD-10-CM

## 2017-07-27 NOTE — Patient Instructions (Signed)
1. We have sent a referral to [facility] for your MRI and they will call you directly to schedule your appt. They are located at[address], [place], [place] [address] . If you need to contact them directly please call [phone]. We will call with results.

## 2017-07-28 ENCOUNTER — Telehealth: Payer: Self-pay | Admitting: Neurology

## 2017-07-28 NOTE — Telephone Encounter (Signed)
Pt called and said she wants to know if her MRI can be scheduled in [place] instead of [place] because they are scheduling out

## 2017-07-29 NOTE — Telephone Encounter (Signed)
Spoke with [facility] in [place]. They state they do have a couple options for 08/02/2017. They have to schedule with patient directly, so she is reaching out to the patient to get that scheduled today.

## 2017-08-05 ENCOUNTER — Inpatient Hospital Stay: Admit: 2017-08-05 | Discharge: 2017-08-05 | Disposition: A | Discharge status: Home / Self Care | Payer: Self-pay

## 2017-08-05 ENCOUNTER — Ambulatory Visit
Admission: RE | Admit: 2017-08-05 | Discharge: 2017-08-05 | Disposition: A | Discharge status: Home / Self Care | Payer: PRIVATE HEALTH INSURANCE | Source: Ambulatory Visit | Attending: Neurology | Admitting: Neurology

## 2017-08-05 DIAGNOSIS — E348 Other specified endocrine disorders: Secondary | ICD-10-CM

## 2017-08-05 DIAGNOSIS — M542 Cervicalgia: Secondary | ICD-10-CM

## 2017-08-05 DIAGNOSIS — R2681 Unsteadiness on feet: Secondary | ICD-10-CM

## 2017-08-05 DIAGNOSIS — R292 Abnormal reflex: Secondary | ICD-10-CM

## 2017-08-05 DIAGNOSIS — Z85038 Personal history of other malignant neoplasm of large intestine: Secondary | ICD-10-CM

## 2017-08-05 MED ORDER — GADOBENATE DIMEGLUMINE 529 MG/ML IV SOLN
17.0000 mL | Freq: Once | INTRAVENOUS | Status: AC | PRN
  Administered 2017-08-05: 17 mL via INTRAVENOUS

## 2017-08-08 ENCOUNTER — Encounter: Payer: Self-pay | Admitting: Neurology

## 2017-08-08 ENCOUNTER — Telehealth: Payer: Self-pay | Admitting: Neurology

## 2017-08-08 DIAGNOSIS — R2681 Unsteadiness on feet: Secondary | ICD-10-CM

## 2017-08-08 NOTE — Telephone Encounter (Signed)
-----   Message from [place], DO sent at 08/08/2017  7:34 AM EST ----- Lindsay Johnson, let pt know that I reviewed MRI brain and cervical spine and both look okay.  As we talked about (and in her note), we can schedule EMG to make sure not missing anything else (legs)

## 2017-08-08 NOTE — Telephone Encounter (Signed)
Patient wants to talk to [facility] about the EMG She is sch for Feb 26 please let me know when the orders are in so I may link them

## 2017-08-08 NOTE — Addendum Note (Signed)
Addended byAnnamaria Helling on: 08/08/2017 01:33 PM   Modules accepted: Orders

## 2017-08-08 NOTE — Telephone Encounter (Signed)
Spoke with patient and answered her questions about testing. Okay to link orders.

## 2017-08-08 NOTE — Telephone Encounter (Signed)
Patient wrote Lindsay Johnson. Information given to her via mychart.

## 2017-08-09 ENCOUNTER — Other Ambulatory Visit: Payer: Self-pay | Admitting: Gastroenterology

## 2017-08-09 ENCOUNTER — Encounter: Payer: Self-pay | Admitting: Neurology

## 2017-08-09 ENCOUNTER — Encounter: Payer: Self-pay | Admitting: Hematology & Oncology

## 2017-08-09 NOTE — Telephone Encounter (Signed)
Spoke with [facility] reading room at [phone] and asked them to review MR to make sure they did not see a pineal cyst, as previous MR at [facility] on 09/03/2016 showed "cystic lesion in the pineal region measuring 11 mm in craniocaudal dimension". The doctor who read the MR will not be in until tonight, but will review it.  Dr. Carles Collet Juluis Rainier.

## 2017-08-10 ENCOUNTER — Encounter: Payer: Self-pay | Admitting: Neurology

## 2017-08-10 NOTE — Telephone Encounter (Signed)
This encounter was created in error - please disregard.

## 2017-08-10 NOTE — Telephone Encounter (Signed)
-----   Message from [place], DO sent at 08/10/2017  7:34 AM EST ----- Let pt know that I had radiology measure cyst and it was same size as previous per radiology.

## 2017-08-12 ENCOUNTER — Ambulatory Visit: Payer: PRIVATE HEALTH INSURANCE | Admitting: Hematology & Oncology

## 2017-08-12 ENCOUNTER — Other Ambulatory Visit: Payer: PRIVATE HEALTH INSURANCE

## 2017-08-15 DIAGNOSIS — E063 Autoimmune thyroiditis: Secondary | ICD-10-CM | POA: Insufficient documentation

## 2017-08-15 DIAGNOSIS — Z8349 Family history of other endocrine, nutritional and metabolic diseases: Secondary | ICD-10-CM | POA: Insufficient documentation

## 2017-08-20 ENCOUNTER — Encounter: Payer: Self-pay | Admitting: Neurology

## 2017-08-21 ENCOUNTER — Encounter: Payer: Self-pay | Admitting: Neurology

## 2017-08-30 ENCOUNTER — Encounter: Payer: Self-pay | Admitting: Gastroenterology

## 2017-08-30 ENCOUNTER — Ambulatory Visit (INDEPENDENT_AMBULATORY_CARE_PROVIDER_SITE_OTHER): Payer: PRIVATE HEALTH INSURANCE | Admitting: Neurology

## 2017-08-30 DIAGNOSIS — R2681 Unsteadiness on feet: Secondary | ICD-10-CM | POA: Diagnosis not present

## 2017-08-30 NOTE — Procedures (Signed)
[facility]  [address]  [place], [place] [address] Tel: [phone] Fax:  [phone] Test Date:  08/30/2017  Patient: Lindsay Johnson DOB: 1960-12-23 Physician: Narda Amber, DO  Sex: Female Height: 5\' 4"  Ref Phys: Alonza Bogus, D.O.  ID#: [id number] Temp: 37.5C Technician:    Patient Complaints: This is a 57 year-old female referred for evaluation of imbalance and gait instability.  NCV & EMG Findings: Extensive electrodiagnostic testing of the right lower extremity and additional studies of the left shows:  1. Bilateral sural and superficial peroneal sensory responses are normal. 2. Bilateral peroneal and tibial motor responses are within normal limits. 3. Bilateral tibial H reflex studies are within normal limits. 4. There is no evidence of active or chronic motor axon loss changes affecting any of the tested muscles. Motor unit configuration and recruitment pattern is within normal limits.  Impression: This is a normal study of the lower extremities.   In particular, there is no evidence of a sensorimotor polyneuropathy or lumbosacral radiculopathy.    ___________________________ Narda Amber, DO    Nerve Conduction Studies Anti Sensory Summary Table   Stim Site NR Peak (ms) Norm Peak (ms) P-T Amp (V) Norm P-T Amp  Left Sup Peroneal Anti Sensory (Ant Lat Mall)  12 cm    3.0 <4.6 11.1 >4  Right Sup Peroneal Anti Sensory (Ant Lat Mall)  12 cm    2.5 <4.6 11.3 >4  Left Sural Anti Sensory (Lat Mall)  Calf    3.0 <4.6 27.1 >4  Right Sural Anti Sensory (Lat Mall)  Calf    2.9 <4.6 25.9 >4   Motor Summary Table   Stim Site NR Onset (ms) Norm Onset (ms) O-P Amp (mV) Norm O-P Amp Site1 Site2 Delta-0 (ms) Dist (cm) Vel (m/s) Norm Vel (m/s)  Left Peroneal Motor (Ext Dig Brev)  Ankle    3.1 <6.0 5.6 >2.5 B Fib Ankle 7.1 35.0 49 >40  B Fib    10.2  5.3  Poplt B Fib 1.3 8.0 62 >40  Poplt    11.5  5.2         Right Peroneal Motor (Ext Dig Brev)    Ankle    3.3 <6.0 5.9 >2.5 B Fib Ankle 7.2 36.0 50 >40  B Fib    10.5  5.8  Poplt B Fib 1.4 8.0 57 >40  Poplt    11.9  5.8         Left Tibial Motor (Abd Hall Brev)  Ankle    3.0 <6.0 15.4 >4 Knee Ankle 8.6 38.0 44 >40  Knee    11.6  11.3         Right Tibial Motor (Abd Hall Brev)  Ankle    3.2 <6.0 17.2 >4 Knee Ankle 8.6 39.0 45 >40  Knee    11.8  12.1          H Reflex Studies   NR H-Lat (ms) Lat Norm (ms) L-R H-Lat (ms)  Left Tibial (Gastroc)     34.83 <35 4.08  Right Tibial (Gastroc)     30.75 <35 4.08   EMG   Side Muscle Ins Act Fibs Psw Fasc Number Recrt Dur Dur. Amp Amp. Poly Poly. Comment  Left AntTibialis Nml Nml Nml Nml Nml Nml Nml Nml Nml Nml Nml Nml N/A  Left Gastroc Nml Nml Nml Nml Nml Nml Nml Nml Nml Nml Nml Nml N/A  Left Flex Dig Long Nml Nml Nml Nml Nml Nml  Nml Nml Nml Nml Nml Nml N/A  Left RectFemoris Nml Nml Nml Nml Nml Nml Nml Nml Nml Nml Nml Nml N/A  Left GluteusMed Nml Nml Nml Nml Nml Nml Nml Nml Nml Nml Nml Nml N/A  Right AntTibialis Nml Nml Nml Nml Nml Nml Nml Nml Nml Nml Nml Nml N/A  Right Gastroc Nml Nml Nml Nml Nml Nml Nml Nml Nml Nml Nml Nml N/A  Right Flex Dig Long Nml Nml Nml Nml Nml Nml Nml Nml Nml Nml Nml Nml N/A  Right RectFemoris Nml Nml Nml Nml Nml Nml Nml Nml Nml Nml Nml Nml N/A  Right GluteusMed Nml Nml Nml Nml Nml Nml Nml Nml Nml Nml Nml Nml N/A      Waveforms:

## 2017-08-31 ENCOUNTER — Encounter: Payer: Self-pay | Admitting: Hematology & Oncology

## 2017-08-31 ENCOUNTER — Telehealth: Payer: Self-pay | Admitting: Neurology

## 2017-08-31 NOTE — Telephone Encounter (Signed)
Mychart message sent to patient.

## 2017-08-31 NOTE — Telephone Encounter (Signed)
-----   Message from Alda Berthold, DO sent at 08/31/2017 11:28 AM EST ----- Please inform patient that her nerve testing is normal - no signs of neuropathy. Thanks.

## 2017-09-04 ENCOUNTER — Encounter: Payer: Self-pay | Admitting: Neurology

## 2017-09-12 DIAGNOSIS — K219 Gastro-esophageal reflux disease without esophagitis: Secondary | ICD-10-CM | POA: Insufficient documentation

## 2017-09-26 ENCOUNTER — Other Ambulatory Visit: Payer: Self-pay | Admitting: Gastroenterology

## 2017-09-28 ENCOUNTER — Ambulatory Visit: Payer: PRIVATE HEALTH INSURANCE | Admitting: Neurology

## 2017-10-03 DIAGNOSIS — G4733 Obstructive sleep apnea (adult) (pediatric): Secondary | ICD-10-CM | POA: Insufficient documentation

## 2017-10-04 ENCOUNTER — Encounter: Payer: Self-pay | Admitting: Gastroenterology

## 2017-11-18 ENCOUNTER — Encounter: Payer: Self-pay | Admitting: Gastroenterology

## 2017-11-22 ENCOUNTER — Telehealth: Payer: Self-pay | Admitting: Endocrinology

## 2017-11-22 ENCOUNTER — Telehealth: Payer: Self-pay

## 2017-11-22 NOTE — Telephone Encounter (Signed)
Referred by: Dr.Cumella  [phone]  Family HX od Hypothyroidism  *Requesting [place]    *Referral incomplete, waiting on Labs/Office Notes

## 2017-11-22 NOTE — Telephone Encounter (Signed)
Patient called regarding status of her referral (eRecord 11/18/17). Explained we will contact her post triage to schedule.

## 2017-11-25 NOTE — Telephone Encounter (Signed)
Patient called to schedule new patient appointment. Per note below, we waiting for labs and office visit notes.

## 2017-12-02 NOTE — Telephone Encounter (Signed)
Labs have not yet been received.

## 2017-12-02 NOTE — Telephone Encounter (Signed)
Patient called for an update on her referral, she states she was advised by someone the labs and notes were received, Probation officer cannot confirm. Please return her call at [phone] to advise.

## 2017-12-09 ENCOUNTER — Encounter: Payer: Self-pay | Admitting: Otolaryngology

## 2017-12-09 ENCOUNTER — Ambulatory Visit: Payer: PRIVATE HEALTH INSURANCE | Attending: Otolaryngology | Admitting: Otolaryngology

## 2017-12-09 VITALS — BP 111/67 | HR 60 | Ht 64.0 in | Wt 184.4 lb

## 2017-12-09 DIAGNOSIS — H9011 Conductive hearing loss, unilateral, right ear, with unrestricted hearing on the contralateral side: Secondary | ICD-10-CM

## 2017-12-09 DIAGNOSIS — H6691 Otitis media, unspecified, right ear: Secondary | ICD-10-CM

## 2017-12-09 DIAGNOSIS — Z9622 Myringotomy tube(s) status: Secondary | ICD-10-CM

## 2017-12-09 DIAGNOSIS — H7011 Chronic mastoiditis, right ear: Secondary | ICD-10-CM

## 2017-12-09 NOTE — Progress Notes (Signed)
SUBJECTIVE:  57 y.o. female who was seen in our office on 12/09/2017 for routine ear check. 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 She had been followed by Dr. May in [place] but recently [place] back [place] the [place]. She last surgery was 4 years ago. She has had a tube in the right ear for the past couple of years. Dr. May has replaced it when necessary. Her last visit with Dr. May was in December 2018 and everything was stable at that time. She reports right hearing loss and tinnitus. However, she is able to manage the hearing loss. The tinnitus is also not bothersome to her. She denies ear drainage, pain, and infection. She denies vertigo. She does have some balance issues which are getting worse. This is associated with a hand tremor. She has seen 3 neurologists in [place] without a diagnosis. She is seeing an endocrinologist in the next couple of weeks.    Medications as recorded in the electronic medical record have been reviewed and reconciled. A complete review of systems was performed including constitutional symptoms, eyes/vision, cardiovascular, respiratory, gastrointestinal, genitourinary, musculoskeletal, integumentary, neurological, psychiatric, endocrine, hematologic, allergic and immunologic. All systems reviewed were unremarkable except for those recorded in the history section and the scanned media section of the archives in the electronic medical record. Past medical history, social history, family history, medications and allergies as recorded in the history section and the scanned media section of the archives in the electronic medical record have been reviewed.       OBJECTIVE:  BP 111/67    Pulse 60    Ht 1.626 m (5\' 4" )    Wt 83.6 kg (184 lb 6.4 oz)    BMI 31.65 kg/m   she is in no acute distress.  On the right it appears she has had a canal wall down mastoidectomy. The mastoid cavity  is well aerated. There is minimal debris that was removed. Ear tube is present in the the anterior tympanic membrane. There is no drainage, infection, granulation tissue, or cholesteatoma. The left external auditory canal is clear.  The tympanic membrane is clear and mobile without middle ear effusions.  There is no acute inflammation or sign of acute infection.  The nose and throat are clear without inflammation or drainage.  The neck is benign without adenopathy or masses.  The remainder of the ENT exam is unremarkable. With tuning fork exam hearing is better on the left. Weber does lateralize to the right. Air conduction is greater than bone conduction on the left, bone conduction greater than air conduction on the right.    ASSESSMENT/PLAN:  The status of the exam has been reviewed with the patient. She has no complaints today. The right ear appears healthy and stable. The tube is in position and patent. Follow up in 6 months for recheck.    If I can provide any further information regarding this visit, please feel free to contact me.  Thank you for the opportunity of seeing Carrie Miles and participating in their care.    Sincerely,    Mliss Fritz, MD  Resident Physician  Otolaryngology - Head & Neck Surgery    Attestation: I saw and evaluated the patient.  Patient personally seen and examined.  I agree with the resident's findings and plan of care as documented above.  There are no additions, deletions or corrections.  Medications as recorded in the  electronic medical record have been reviewed and reconciled.  A complete review of systems was performed including constitutional symptoms, eyes/vision, cardiovascular, respiratory, gastrointestinal, genitourinary, musculoskeletal, integumentary, neurological, psychiatric, endocrine, hematologic, allergic and immunologic.  All systems reviewed were unremarkable except for those recorded in the history section and the scanned media section of the archives in the  electronic medical record. Past medical history, social history, family history, medications and allergies as recorded in the history section and the scanned media section of the archives in the electronic medical record have been reviewed.    Her history is as recorded above.  She has had multiple surgeries on the right ear dating back to the 1990s.  She has had no recent complaints regarding the right ear.  There's been no pain, drainage or bleeding.  Subjectively, she feels that she is getting along adequately with the hearing loss and the tinnitus.  She does have some chronic imbalance issues with a tremor.  So far, there has been no diagnosis for the symptoms.  She is scheduled to see an endocrinologist within the next month.    She is in no acute distress.  The external auditory canal on the left is clear.  The tympanic membrane is intact, clear and mobile.  On the right, there is a tube that is intact, clear and functional.  The perforation around the tube is somewhat enlarged.  There is scarring of the tympanic membrane.  She has a canal wall down mastoid cavity.  Dried crusting was cleaned from the attic region.  The lining of the ear is dry, healthy and stable.  There is no evidence of granulation tissue or evolving cholesteatoma.  The nose and throat are clear without inflammation or drainage.  The neck is benign without adenopathy or masses.  The remainder of the ENT exam is unremarkable.  By tuning fork exam, the hearing is better on the left than the right, her Weber lateralizes to the right side and air conduction is greater than bone conduction on the left while air conduction equals bone conduction on the right.    Procedure: Under the microscope, the right ear and mastoid cavity were thoroughly debrided of lining desquamated material by alligator forceps.   There is a tube in the anterior tympanic membrane.  The perforation around the tube is somewhat enlarged.  Nevertheless, this area is clean and  dry without drainage, granulation tissue or any other reactive pathology.  The posterior recesses of the mastoid cavity were tediously debrided.  There is no granulation tissue or evolving cholesteatoma.  The lining of the ear and mastoid cavity is, otherwise, clean, dry, healthy and mature.   She tolerated the procedure well.    The status of the exam has been reviewed with Carrie Miles.  She has been reassured regarding the stability of the exam and the tube.  The right ear was treated with topical boric acid powder.  She will return for follow-up in 6 months or on a when necessary basis.  Obviously, if she moves out of the area in the next 6 months, she will follow-up in her new neighborhood.  Her questions have been answered.    If I can provide any further information regarding this patient's visit, please feel free to contact me.       Caroline More. Lonzo Candy., MD  Associate Professor and Associate Chairman  Department of Otolaryngology/Director, [facility]    .

## 2017-12-10 ENCOUNTER — Encounter: Payer: Self-pay | Admitting: Otolaryngology

## 2017-12-12 ENCOUNTER — Encounter: Payer: Self-pay | Admitting: Otolaryngology

## 2017-12-12 ENCOUNTER — Other Ambulatory Visit: Payer: Self-pay | Admitting: Otolaryngology

## 2017-12-12 DIAGNOSIS — R42 Dizziness and giddiness: Secondary | ICD-10-CM

## 2017-12-12 NOTE — Telephone Encounter (Signed)
Faxing referral per patient's request

## 2017-12-15 ENCOUNTER — Encounter: Payer: Self-pay | Admitting: Neurosurgery

## 2017-12-15 ENCOUNTER — Ambulatory Visit: Payer: PRIVATE HEALTH INSURANCE | Attending: Neurosurgery | Admitting: Neurosurgery

## 2017-12-15 VITALS — BP 113/71 | HR 69 | Ht 64.0 in | Wt 187.0 lb

## 2017-12-15 DIAGNOSIS — R2689 Other abnormalities of gait and mobility: Secondary | ICD-10-CM | POA: Insufficient documentation

## 2017-12-15 DIAGNOSIS — R258 Other abnormal involuntary movements: Secondary | ICD-10-CM | POA: Insufficient documentation

## 2017-12-15 DIAGNOSIS — M542 Cervicalgia: Secondary | ICD-10-CM | POA: Insufficient documentation

## 2017-12-15 DIAGNOSIS — M25562 Pain in left knee: Secondary | ICD-10-CM | POA: Insufficient documentation

## 2017-12-15 NOTE — Progress Notes (Signed)
Carrie Miles was seen in consultation on December 15, 2017 regarding difficulty with her balance.  Carrie Miles is a 57 year old right-handed married former preschool teacher who began having difficulty in about February 2018.  She is accompanied by her husband to today's visit.  She noticed that she started having trouble with tremor in her hands.  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    More recently she is having having trouble with her balance and beginning to have falls.  She was carrying a flower pot in her yard and fell.  She said she turned a corner and fell and she was trying to pick some rhubarb out of her garden and fell.    Patient's care has mostly been in [place].  She has just recently returned to this area.  At the present time her husband is interviewing for jobs and she may be leaving the area depending on where he finds work.    Her left knee is bothering her at the present time but she feels that because she's been helping her sister since her sister had some surgery done. She has had a cortisone injection in her right knee in the past.    She does complain of neck pain that began in November 2018.  She has a difficult time turning when she's trying to check for blind spots when she is driving.  Patient has had an MRI of her cervical spine and was told it was okay.    Patient was seen by rheumatology in December 2018 and did have positive antithyroid antibody but no other abnormalities and her primary care provider's note indicates her thyroid function is normal.    Patient also tells me she had an EMG that was normal.  She denies any difficulty with numbness.    Patient has been seen by at at least 2 neurologists and had an EMG.  She has not been given a specific diagnosis. She has had MRI of her brain and cervical  spine.    Patient is having difficulty with tremors in both hands when she tries to do something.  She has never noticed tremor at rest.    Overall she feels she moves very slowly.  Patient also complaining that she feels foggy and can't really think straight.  Patient's husband adds that he needs to explain things to her several times for her to understand.    Review of systems:  Patient has chronic fatigue, her left knee hurts right now.  Both ears ring.  She feels weak all over.  She does have neck pain.  She has fallen as noted above.  Patient has a history of having had cholesteatoma with surgery in 1993.  Dr. Candace Cruise apparently did a revision surgery on her.  He saw her recently and felt that the cholesteatoma is not contributing to her current problems.  Patient has never had dizziness associated with the cholesteatoma.  Patient currently has a tympanostomy tube in her right ear.  The remainder of her review of systems is negative.    Past Medical History:   Diagnosis Date    Anxiety     dx about 2005    Cancer      colon, cancer free    Family history of thyroid problem     High cholesterol  Interstitial cystitis      Past Surgical History:   Procedure Laterality Date    cholesteotomy surgery      COLONOSCOPY      TYMPANOSTOMY TUBE PLACEMENT Right 2015     Social History     Social History    Marital status: Married     Spouse name: N/A    Number of children: N/A    Years of education: N/A     Social History Main Topics    Smoking status: Never Smoker    Smokeless tobacco: Never Used    Alcohol use 3.6 oz/week     6 Glasses of wine per week      Comment: wine    Drug use: No    Sexual activity: Yes     Partners: Male     Birth control/ protection: Other-see comments     Other Topics Concern    None     Social History Narrative    None     Family History   Problem Relation Age of Onset    Thyroid disease Mother     Alcohol abuse Mother     GERD Father     Heart Disease Father      High Blood Pressure Father     Thyroid disease Father     High cholesterol Father     High Blood Pressure Sister     Alcohol abuse Brother     Alcohol abuse Sister      Current Outpatient Prescriptions   Medication Sig Dispense Refill    pentosan polysulfate (ELMIRON) 100 MG capsule Take 100 mg by mouth 3 times daily (before meals)      sertraline (ZOLOFT) 100 MG tablet Take 150 mg by mouth daily      ranitidine (ZANTAC) 150 MG tablet Take 150 mg by mouth 2 times daily      cholecalciferol (VITAMIN D) 2000 units tablet Take 2,000 Units by mouth daily      cyanocobalamin (VITAMIN B-12) 500 MCG tablet Take 500 mcg by mouth daily      atorvastatin (LIPITOR) 10 MG tablet Take 10 mg by mouth daily      cetirizine (ZYRTEC) 10 MG tablet Take 10 mg by mouth daily      co-enzyme Q-10 (CO-ENZYME Q-10) 100 MG capsule Take 100 mg by mouth daily      propranolol (INDERAL) 20 MG tablet Take 20 mg by mouth 2 times daily       No current facility-administered medications for this visit.      Allergies:  Codeine and Sulfa antibiotics    PHYSICAL EXAM:  Vitals:    12/15/17 0857   BP: 113/71   Pulse: 69   Weight: 84.8 kg (187 lb)   Height: 1.626 m (5\' 4" )       CARDIOVASCULAR/PULMONARY  Supine BP:  132/85      Seated BP:  125/74        Standing BP:  108/69   HR:  68                      HR:  73  HR:  70  She has no complaints of lightheadedness or dizziness with changing position.     Cardiac:  Heart is regular rate and rhythm.  Pulmonary:  Lungs are clear to auscultation.     NEURO EXAM  Mental status:  Patient is alert and conversant.  She does not appear to be  in any acute distress.  Montral cognitive assessment score is 26/30.  She had difficulty in repeating sentences and could not recall 2 of 5 objects.  Her score is considered normal     Cranial nerves:  Cranial nerves reveal pupils are round and reactive to light, extraocular movements are intact.  Facial features are symmetric, tongue is midline, soft palate  elevates symmetrically.  Confrontational fields appear to be full.  Weber lateralizes to the right.  Rinne is normal bilaterally.    Checkerboard:  Gazing at a checkerboard does not provoke any dizziness.    Motor/ROM:  Strength testing reveals her strength to be 5/5 throughout proximally and distally in her arms and legs bilaterally.  She may have some slight increased tone.    Sensory:  Vibratory sensation is 21 seconds in her index fingers and 12 seconds in his great toes bilaterally.    DTR:  Deep tendon reflexes are +2 throughout including her biceps, triceps, brachioradialis, patella and Achilles bilaterally.  Plantar reflexes are downgoing bilaterally.    Coordination/Romberg:  Finger to nose and heel-to-shin are smooth.  She was able to maintain her balance in standard Romberg stance for 20 seconds after practicing.  Rapidly alternating movements are slow.  Finger tapping is slowed in both hands.  .  Gait:  Her gait appears steady however she has decreased arm swing bilaterally.    Modified Hallpike:  Negative bilaterally for dizziness or nystagmus.    Impression:  The patient seems to be presenting with a slowed movements and postural instability.  Neurodegenerative process cannot entirely be excluded.    She has neck pain that has been going on for some time although she has a report of an MRI of the cervical spine does not reveal any stenosis.    Plan:  Patient is asked to sign release of records for the neurologic consultations that were performed within the last year.    Patient is asked to sign a release of records for that central nervous system imaging that has been performed within the last year for the actual disks.    Patient is asked to proceed with flexion extension views of her cervical spine to make certain her cervical spine is stable.    Of note, patient has no insurance at the present time and is quite concerned about her bills.  Physical therapy was offered but she feels it will be too  expensive. She can practice her balance with her back to a corner, feet together and eyes closed with a high back chair in front of her. She can begin with 20 seconds and increase to 1 minute.    Follow-up in 6-8 weeks.

## 2017-12-16 ENCOUNTER — Encounter: Payer: Self-pay | Admitting: Neurosurgery

## 2017-12-16 ENCOUNTER — Encounter: Payer: Self-pay | Admitting: Gastroenterology

## 2017-12-16 NOTE — Telephone Encounter (Signed)
LM, attempt 1 of 2.

## 2017-12-16 NOTE — Telephone Encounter (Signed)
Labs received.  Per Taylor. NA Gen endo.

## 2017-12-17 ENCOUNTER — Encounter: Payer: Self-pay | Admitting: Neurosurgery

## 2017-12-19 NOTE — Telephone Encounter (Signed)
Patient is seeing an Endocrinologist at [facility] and does not wish to make an appointment with [place] at this time.

## 2017-12-21 ENCOUNTER — Encounter: Payer: Self-pay | Admitting: Neurosurgery

## 2017-12-21 ENCOUNTER — Telehealth: Payer: Self-pay

## 2017-12-21 NOTE — Telephone Encounter (Signed)
Patient called about a medication for Parkinsons you said you were willing to try with her.  She would like you to call her at [phone].      Eduard Clos Bhumi Godbey

## 2017-12-22 ENCOUNTER — Inpatient Hospital Stay: Admit: 2017-12-22 | Discharge: 2017-12-22 | Disposition: A | Discharge status: Home / Self Care | Payer: Self-pay

## 2017-12-22 ENCOUNTER — Telehealth: Payer: Self-pay

## 2017-12-22 ENCOUNTER — Other Ambulatory Visit: Payer: Self-pay | Admitting: Gastroenterology

## 2017-12-22 NOTE — Telephone Encounter (Signed)
Spoke to patient.  I have not yet received outside records.  Patient is very anxious and wants to try medications for Parkinson's to see if that works.  My concern is she was orthostatic when I saw her initially.  Patient agreed that she will increase her fluids over the next week and come in for an appointment next Thursday, December 29, 2017.  I will recheck her orthostatics and will yet discuss trial of Sinemet.

## 2017-12-22 NOTE — Telephone Encounter (Signed)
Patient called and would like you to call her back at [phone].        Carrie Miles

## 2017-12-28 ENCOUNTER — Encounter: Payer: Self-pay | Admitting: Neurosurgery

## 2017-12-29 ENCOUNTER — Encounter: Payer: Self-pay | Admitting: Neurosurgery

## 2017-12-29 ENCOUNTER — Ambulatory Visit: Payer: PRIVATE HEALTH INSURANCE | Attending: Neurosurgery | Admitting: Neurosurgery

## 2017-12-29 ENCOUNTER — Other Ambulatory Visit: Payer: Self-pay

## 2017-12-29 VITALS — BP 130/80 | HR 68 | Ht 64.0 in | Wt 185.0 lb

## 2017-12-29 DIAGNOSIS — R258 Other abnormal involuntary movements: Secondary | ICD-10-CM

## 2017-12-29 DIAGNOSIS — R2689 Other abnormalities of gait and mobility: Secondary | ICD-10-CM

## 2017-12-29 NOTE — Progress Notes (Signed)
Carrie Miles was seen in follow-up on December 29, 2017 regarding difficulty with her balance.  She has not had any falls since I saw her last.  She did begin tai chi exercise program.    Since last visit she did have an x-ray of her cervical spine that reveals no fracture or misalignment.  There is also no evidence of dynamic instability.  Images were reviewed    I did receive outside records from:  [facility].  Patient was seen by Dr. Wells Guiles Tat who is apparently a movement disorder specialist.  Assessment and plan revealed gait instability with hyperreflexia and neck pain.  An MRI of her brain and cervical spine were done.  Reports of those images reveal:  MRI cervical spine mild straightening of the normal cervical lordosis no listhesis or malalignment cord shows signal intensity within the cervical spinal cord is normal.  Vertebrae reveal body heights are well maintained without evidence for acute or chronic fracture.  There are no worrisome auspicious lesions.  There are mild degenerative changes with disc bulging at C3-4 through C6-7 without significant stenosis.  There is mild left C5 foraminal narrowing.      MRI of the brain revealed no acute abnormality.    Patient also had EMG test that did not reveal any peripheral neuropathy.    Patient had been treated already with propranolol for tremor however when I discuss how she takes the propranolol she apparently just uses it as needed and is still on her initial bottle of medication. I have no idea how often she uses the medication. There was no tremor evident on the examination of Dr. Carles Collet.    Patient's husband recalls that on follow-up visit Dr. Carles Collet indicated she did not feel the patient's symptoms were neurologic in origin.  No further visits were made.  I do not have the follow-up note.      Patient is wondering if her Zoloft may be a problem and causing her side effects.  She was apparently started on this 15 years ago when she had what she terms a road rage  after her oldest child went middle school.  She does not feel particularly anxious or depressed at the present time.  She has already started to decrease her Zoloft.    With regards to the sleep disturbance that is noted patient tells me she has mild sleep apnea but has elected not to do anything about it.  She was told she would be better if she did not sleep on her back but she does sleep on her back most of the time.  Patient continues to complain that she feels very fatigued.    Patient had numerous blood studies including TSH, CMP, sedimentation rate, CK, CBC and iron studies all of which were normal.  Her B-12 is 879.  She did have a positive ANA at 1:160 but other rheumatologic studies were done and she was evaluated by rheumatology with no rheumatologic diagnosis.      Current Outpatient Prescriptions   Medication Sig Dispense Refill    pentosan polysulfate (ELMIRON) 100 MG capsule Take 100 mg by mouth 3 times daily (before meals)      sertraline (ZOLOFT) 100 MG tablet Take 150 mg by mouth daily      ranitidine (ZANTAC) 150 MG tablet Take 150 mg by mouth 2 times daily      cholecalciferol (VITAMIN D) 2000 units tablet Take 2,000 Units by mouth daily      cyanocobalamin (VITAMIN  B-12) 500 MCG tablet Take 500 mcg by mouth daily      atorvastatin (LIPITOR) 10 MG tablet Take 10 mg by mouth daily      cetirizine (ZYRTEC) 10 MG tablet Take 10 mg by mouth daily      propranolol (INDERAL) 20 MG tablet Take 20 mg by mouth 2 times daily   Only uses      co-enzyme Q-10 (CO-ENZYME Q-10) 100 MG capsule Take 100 mg by mouth daily       No current facility-administered medications for this visit.       Allergies:    Codeine and Sulfa antibiotics    Examination:    Vitals:    12/29/17 0917   BP: 130/80   Pulse: 68   Weight: 83.9 kg (185 lb)   Height: 1.626 m ('5\' 4"' )     Patient appears her stated age.    She can maintain her balance for 20 seconds in standard Romberg stance with eyes closed.  She does lose her  balance in 1/2 sharpened  Romberg stance.    Patient is able to copy a spiral with no difficulty.  There is no evidence of tremor on spiral.    Impression:  Patient continues to appear to be bradykinetic but not clearly parkinsonian.    Plan:  Patient can continue to slowly taper her Zoloft to see if that makes any difference.  She can drop to 100 mg for the next 2 weeks then 100 mg alternating with 50 mg for a week and then 50 mg daily for a week and then discontinue.  If she feels poorly when she is doing that she can stop her taper.    Patient is asked to sleep on her side.  She is asked to bring me her sleep study information.    Patient is asked to record how often she takes her propranolol.    Follow up in 1 month.

## 2017-12-29 NOTE — Telephone Encounter (Signed)
Discussed at visit

## 2018-01-09 ENCOUNTER — Encounter: Payer: Self-pay | Admitting: Neurosurgery

## 2018-01-10 ENCOUNTER — Encounter: Payer: Self-pay | Admitting: Neurosurgery

## 2018-01-19 ENCOUNTER — Ambulatory Visit: Payer: PRIVATE HEALTH INSURANCE | Attending: Neurosurgery | Admitting: Neurosurgery

## 2018-01-19 ENCOUNTER — Encounter: Payer: Self-pay | Admitting: Neurosurgery

## 2018-01-19 VITALS — BP 100/65 | HR 62 | Ht 64.0 in | Wt 187.0 lb

## 2018-01-19 DIAGNOSIS — R2689 Other abnormalities of gait and mobility: Secondary | ICD-10-CM

## 2018-01-19 DIAGNOSIS — R258 Other abnormal involuntary movements: Secondary | ICD-10-CM

## 2018-01-19 MED ORDER — CARBIDOPA-LEVODOPA 25-100 MG PO TABS *I*
ORAL_TABLET | ORAL | 0 refills | Status: DC
Start: 2018-01-19 — End: 2018-02-02

## 2018-01-19 NOTE — Progress Notes (Signed)
Carrie Miles was seen in follow-up on January 19, 2018 regarding difficulty with slowness of movement.    She took herself off of Zoloft and has been off since July 7.  She feels maybe a little bit edgier and is wondering if she should resume Zoloft at some point.    She tried taking propranolol 20 mg twice a day which she has done for 2 weeks.  It has not benefited her in any way that she is aware of.  She does feel somewhat fatigued.    Current Outpatient Prescriptions   Medication Sig Dispense Refill    pentosan polysulfate (ELMIRON) 100 MG capsule Take 100 mg by mouth 3 times daily (before meals)      ranitidine (ZANTAC) 150 MG tablet Take 150 mg by mouth 2 times daily      cholecalciferol (VITAMIN D) 2000 units tablet Take 2,000 Units by mouth daily      cyanocobalamin (VITAMIN B-12) 500 MCG tablet Take 500 mcg by mouth daily      atorvastatin (LIPITOR) 10 MG tablet Take 10 mg by mouth daily      cetirizine (ZYRTEC) 10 MG tablet Take 10 mg by mouth daily      propranolol (INDERAL) 20 MG tablet Take 20 mg by mouth 2 times daily   Only uses      co-enzyme Q-10 (CO-ENZYME Q-10) 100 MG capsule Take 100 mg by mouth daily      carbidopa-levodopa (SINEMET) 25-100 MG per tablet 1/2 tablet twice a day 15 tablet 0     No current facility-administered medications for this visit.      Allergies:  Codeine and Sulfa antibiotics    Examination:  Vitals:    01/19/18 0841   BP: 100/65   Pulse: 62   Weight: 84.8 kg (187 lb)   Height: 1.626 m (5\' 4" )     Patient appears her stated age.    Seated blood pressures 112/59 with a heart rate of 66.  Standing pressures 105/69 with heart rate of 67.  She has no symptoms of lightheadedness and dizziness.    Gait:  Her gait has no arm swing but seems to be able to turn fairly well.    She may have slight increased muscle tone in her arms.    She did draw a spiral for me and there is a bit of shake to her spiral.    She did take a step back towards me when I pulled back on her  shoulders.    Impression:  Patient continues to appear to be bradykinetic.  I'm not sure this is truly Parkinson's.  Unfortunately she has no insurance until October.    Plan:  Discussed option of waiting until October to proceed with a DAT scan however patient and her husband wish to at least trial antiparkinson medication.    Patient is asked to stop her propranolol as I don't think it's doing anything for her and she doesn't either.    After propranolol has been discontinued for 2 days she can trial Sinemet 25/100 one half tablet twice daily.  Side effects including orthostatic hypotension were discussed.    Patient is asked to monitor her blood pressure sitting and standing and stop Sinemet if she becomes lightheaded or does not feel well on the medication for any reason..    Follow-up 1-2 weeks.

## 2018-01-20 ENCOUNTER — Other Ambulatory Visit: Payer: Self-pay

## 2018-01-23 ENCOUNTER — Encounter: Payer: Self-pay | Admitting: Neurosurgery

## 2018-01-26 ENCOUNTER — Encounter: Payer: Self-pay | Admitting: Hematology & Oncology

## 2018-02-01 ENCOUNTER — Encounter: Payer: Self-pay | Admitting: Neurosurgery

## 2018-02-02 ENCOUNTER — Ambulatory Visit: Payer: Self-pay | Admitting: Neurosurgery

## 2018-02-02 ENCOUNTER — Ambulatory Visit: Payer: PRIVATE HEALTH INSURANCE | Attending: Neurosurgery | Admitting: Neurosurgery

## 2018-02-02 ENCOUNTER — Encounter: Payer: Self-pay | Admitting: Neurosurgery

## 2018-02-02 VITALS — BP 118/72 | HR 80 | Ht 64.0 in | Wt 186.0 lb

## 2018-02-02 DIAGNOSIS — R258 Other abnormal involuntary movements: Secondary | ICD-10-CM

## 2018-02-02 DIAGNOSIS — R2689 Other abnormalities of gait and mobility: Secondary | ICD-10-CM

## 2018-02-02 MED ORDER — CARBIDOPA-LEVODOPA 25-100 MG PO TABS *I*
ORAL_TABLET | ORAL | 0 refills | Status: DC
Start: 2018-02-02 — End: 2018-02-14

## 2018-02-02 NOTE — Progress Notes (Signed)
Carrie Miles was seen in follow-up on February 02, 2017 regarding difficulty with her balance.  Today she is complaining of increasing joint pain.  She did try the Sinemet one half tablet twice daily with no particular ill effect.  She did take one whole tablet one day and thought maybe she had a little bit of nausea.  She didn't take any Sinemet this morning. She has not fallen.    Patient did bring me a copy of her sleep study from March 2019.  It does reveal mild obstructive sleep apnea with her lowest oxygen saturation at 81%.  She was recommended to sleep in a nonsupine position which she tells me she is doing.  She was recommended referral to pursue C Pap titration but she still doesn't want to do that.    Patient did go back on her Zoloft.  She says it does make her feel better and less irritable.    Patient will have medical insurance  April 04, 2018.  She prefers to hold off on anything expensive until then.      Current Outpatient Prescriptions   Medication Sig Dispense Refill    sertraline (ZOLOFT) 50 MG tablet Take 50 mg by mouth daily      Pumpkin Seed-Soy Germ (AZO BLADDER CONTROL/GO-LESS PO) Take by mouth      carbidopa-levodopa (SINEMET) 25-100 MG per tablet 1 tablet twice a day. 60 tablet 0    ranitidine (ZANTAC) 150 MG tablet Take 150 mg by mouth 2 times daily      cholecalciferol (VITAMIN D) 2000 units tablet Take 2,000 Units by mouth daily      cyanocobalamin (VITAMIN B-12) 500 MCG tablet Take 500 mcg by mouth daily      atorvastatin (LIPITOR) 10 MG tablet Take 10 mg by mouth daily      cetirizine (ZYRTEC) 10 MG tablet Take 10 mg by mouth daily      co-enzyme Q-10 (CO-ENZYME Q-10) 100 MG capsule Take 100 mg by mouth daily       No current facility-administered medications for this visit.      Allergies:  Codeine and Sulfa antibiotics    Examination:  Vitals:    02/02/18 1048   BP: 118/72   Pulse: 80   Weight: 84.4 kg (186 lb)   Height: 1.626 m (5\' 4" )     Patient appears her stated  age.    Seated blood pressures 118/80 with a heart rate of 80.  Standing pressures 112/80 with a heart rate of 88.  She is not lightheaded.  She has not had any lightheadedness at home on the Sinemet.    Romberg:  She can maintain her balance for 20 seconds.    She may have some increased muscle tone on the right side more than the left side in the arms.  I do not see tremor.    She is able to resist pullback on her shoulders but that it makes her feel quite uncomfortable.    Impression:  Tolerating Sinemet at low dose. Try Sinemet at 25/100 BID to see if this helps her symptoms.    Plan:  Try Sinemet at 25/100 BID.    Follow up in 2 weeks.

## 2018-02-12 ENCOUNTER — Encounter: Payer: Self-pay | Admitting: Otolaryngology

## 2018-02-13 ENCOUNTER — Encounter: Payer: Self-pay | Admitting: Otolaryngology

## 2018-02-14 ENCOUNTER — Encounter: Payer: Self-pay | Admitting: Neurosurgery

## 2018-02-14 ENCOUNTER — Ambulatory Visit: Payer: PRIVATE HEALTH INSURANCE | Attending: Neurosurgery | Admitting: Neurosurgery

## 2018-02-14 VITALS — BP 106/56 | HR 77 | Ht 64.0 in | Wt 186.0 lb

## 2018-02-14 DIAGNOSIS — R2689 Other abnormalities of gait and mobility: Secondary | ICD-10-CM

## 2018-02-14 DIAGNOSIS — R258 Other abnormal involuntary movements: Secondary | ICD-10-CM

## 2018-02-14 MED ORDER — CARBIDOPA-LEVODOPA 25-100 MG PO TABS *I*
ORAL_TABLET | ORAL | 2 refills | Status: DC
Start: 2018-02-14 — End: 2018-03-14

## 2018-02-14 NOTE — Progress Notes (Signed)
Carrie Miles was seen in follow up on 02/14/2018 regarding difficulty with her balance and feeling stiff.  She feels better with the Sinemet and feels it runs out after about 4 hours.      Patient feels her sensation of tremor is gone.    She would like to continue on the medication.     Current Outpatient Prescriptions   Medication Sig Dispense Refill    carbidopa-levodopa (SINEMET) 25-100 MG per tablet One tablet 3 times a day 90 tablet 2    sertraline (ZOLOFT) 50 MG tablet Take 50 mg by mouth daily      Pumpkin Seed-Soy Germ (AZO BLADDER CONTROL/GO-LESS PO) Take by mouth      ranitidine (ZANTAC) 150 MG tablet Take 150 mg by mouth 2 times daily      cholecalciferol (VITAMIN D) 2000 units tablet Take 2,000 Units by mouth daily      cyanocobalamin (VITAMIN B-12) 500 MCG tablet Take 500 mcg by mouth daily      co-enzyme Q-10 (CO-ENZYME Q-10) 100 MG capsule Take 100 mg by mouth daily       No current facility-administered medications for this visit.      Allergies:  Codeine and Sulfa antibiotics    Examination:  Vitals:    02/14/18 0903   BP: 106/56   Pulse: 77   Weight: 84.4 kg (186 lb)   Height: 1.626 m (5\' 4" )     Gait:  She does appear a bit more fluid in her movements.    Romberg:  She can maintain her balance for 20 seconds with no difficulty.    She has no evidence of tremor.    Impression:  Patient feels her gait has improved with the Sinemet.  She does feel that she runs out a medication after about 4-6 hours.    Plan:  Will increase her Sinemet 25/100 to 3 times daily.    Patient has been seen by movement disorder in [place] last year.  She is planning to be back down there probably by mid September or October.  Also by October she will again have medical insurance at that time.  I would like her to return to movement disorder specialist when she returns to [facility].  She will also follow up with her primary care provider in [place].  She will sign medical releases and provide [place]  with her fax numbers.    Follow-up with myself in one month.

## 2018-02-17 ENCOUNTER — Encounter: Payer: Self-pay | Admitting: Neurosurgery

## 2018-02-20 ENCOUNTER — Encounter: Payer: Self-pay | Admitting: Neurosurgery

## 2018-02-21 NOTE — Telephone Encounter (Signed)
Patient will be returning to [facility].  My records will need to be sent to those fax numbers. I believe she signed release last visit.  Patient should have one more follow up with me and then we can send records.

## 2018-03-08 ENCOUNTER — Encounter: Payer: Self-pay | Admitting: Otolaryngology

## 2018-03-14 ENCOUNTER — Encounter: Payer: Self-pay | Admitting: Neurosurgery

## 2018-03-14 ENCOUNTER — Ambulatory Visit: Payer: PRIVATE HEALTH INSURANCE | Attending: Neurosurgery | Admitting: Neurosurgery

## 2018-03-14 VITALS — BP 108/67 | HR 77 | Ht 64.0 in | Wt 186.0 lb

## 2018-03-14 DIAGNOSIS — R258 Other abnormal involuntary movements: Secondary | ICD-10-CM

## 2018-03-14 DIAGNOSIS — R2689 Other abnormalities of gait and mobility: Secondary | ICD-10-CM

## 2018-03-14 MED ORDER — CARBIDOPA-LEVODOPA 25-100 MG PO TABS *I*
ORAL_TABLET | ORAL | 1 refills | Status: DC
Start: 2018-03-14 — End: 2018-03-14

## 2018-03-14 MED ORDER — CARBIDOPA-LEVODOPA 25-100 MG PO TABS *I*
ORAL_TABLET | ORAL | 1 refills | Status: AC
Start: 2018-03-14 — End: ?

## 2018-03-14 NOTE — Progress Notes (Signed)
Carrie Miles was seen in follow-up on March 14, 2018 regarding difficulty sensation of feeling stiff and that she may fall forward when walking.  She was also having difficulty with some fine motor activities.    She has been taking Sinemet CR 25/ 100 three times a day.  Generally she feels like she is moving more fluidly.  She does not feel like she is going to fall forward.  She is having an easier time with grip but still notes her writing is poor.  Sometimes she feels the tremor inside and sometimes it seems to be gone.    She does still feel somewhat stiff and achy.    Patient tells me her family feels she is moving much more quickly and looks better than when she first arrived up [place] in May.    Patient is returning to [facility] on Friday.  Her husband will have medical insurance coverage for her as of April 04, 2018.    Current Outpatient Prescriptions   Medication Sig Dispense Refill    pentosan polysulfate (ELMIRON) 100 MG capsule Take 100 mg by mouth 3 times daily (before meals)      solifenacin (VESICARE) 10 MG tablet Take 10 mg by mouth daily      TURMERIC PO Take by mouth      carbidopa-levodopa (SINEMET) 25-100 MG per tablet One tablet 3 times a day 90 tablet 1    sertraline (ZOLOFT) 50 MG tablet Take 50 mg by mouth daily      Pumpkin Seed-Soy Germ (AZO BLADDER CONTROL/GO-LESS PO) Take by mouth      ranitidine (ZANTAC) 150 MG tablet Take 150 mg by mouth 2 times daily      cholecalciferol (VITAMIN D) 2000 units tablet Take 2,000 Units by mouth daily      cyanocobalamin (VITAMIN B-12) 500 MCG tablet Take 500 mcg by mouth daily      co-enzyme Q-10 (CO-ENZYME Q-10) 100 MG capsule Take 100 mg by mouth daily       No current facility-administered medications for this visit.      Allergies:  Codeine and Sulfa antibiotics    Examination:  Vitals:    03/14/18 0954   BP: 108/67   Pulse: 77   Weight: 84.4 kg (186 lb)   Height: 1.626 m (5\' 4" )     Patient appears her stated age.    Muscle tone  seems to be normal.    She's able to walk in the hallway and turn without any difficulty.  Her gait looks normal.  She seems to be moving more quickly than when I first saw her.    Impression:  Patient seems to have responded to Sinemet with improvement in her balance and gait.    Plan:  Patient is referred to physical therapy for gait, balance and muscle aching.  She will do this [place] once she has Insurance underwriter.    Patient has scheduled herself to be seen by movement disorder specialist in [place] on May 05, 2018.     No follow-up is scheduled with myself at the present time.

## 2018-03-16 ENCOUNTER — Encounter: Payer: Self-pay | Admitting: Neurosurgery

## 2018-03-21 ENCOUNTER — Encounter: Payer: Self-pay | Admitting: Neurosurgery

## 2018-03-22 ENCOUNTER — Encounter: Payer: Self-pay | Admitting: Neurosurgery

## 2018-03-30 DIAGNOSIS — R52 Pain, unspecified: Secondary | ICD-10-CM | POA: Insufficient documentation

## 2018-03-30 DIAGNOSIS — G8929 Other chronic pain: Secondary | ICD-10-CM | POA: Insufficient documentation

## 2018-04-04 DIAGNOSIS — B977 Papillomavirus as the cause of diseases classified elsewhere: Secondary | ICD-10-CM | POA: Insufficient documentation

## 2018-05-01 ENCOUNTER — Encounter: Payer: Self-pay | Admitting: Neurosurgery

## 2018-05-01 NOTE — Telephone Encounter (Signed)
Dr. Carles Collet - [place] records in [place].

## 2018-05-02 ENCOUNTER — Encounter (HOSPITAL_BASED_OUTPATIENT_CLINIC_OR_DEPARTMENT_OTHER): Payer: Self-pay

## 2018-05-02 ENCOUNTER — Other Ambulatory Visit: Payer: Self-pay

## 2018-05-02 ENCOUNTER — Emergency Department (HOSPITAL_BASED_OUTPATIENT_CLINIC_OR_DEPARTMENT_OTHER)
Admission: EM | Admit: 2018-05-02 | Discharge: 2018-05-02 | Disposition: A | Discharge status: Home / Self Care | Payer: 59 | Attending: Emergency Medicine | Admitting: Emergency Medicine

## 2018-05-02 DIAGNOSIS — Z85038 Personal history of other malignant neoplasm of large intestine: Secondary | ICD-10-CM | POA: Insufficient documentation

## 2018-05-02 DIAGNOSIS — R42 Dizziness and giddiness: Secondary | ICD-10-CM | POA: Insufficient documentation

## 2018-05-02 DIAGNOSIS — R110 Nausea: Secondary | ICD-10-CM | POA: Diagnosis present

## 2018-05-02 DIAGNOSIS — Z79899 Other long term (current) drug therapy: Secondary | ICD-10-CM | POA: Diagnosis not present

## 2018-05-02 DIAGNOSIS — F419 Anxiety disorder, unspecified: Secondary | ICD-10-CM | POA: Diagnosis not present

## 2018-05-02 LAB — BASIC METABOLIC PANEL
ANION GAP: 8 (ref 5–15)
BUN: 21 mg/dL — AB (ref 6–20)
CO2: 27 mmol/L (ref 22–32)
Calcium: 9.5 mg/dL (ref 8.9–10.3)
Chloride: 103 mmol/L (ref 98–111)
Creatinine, Ser: 0.62 mg/dL (ref 0.44–1.00)
Glucose, Bld: 122 mg/dL — ABNORMAL HIGH (ref 70–99)
Potassium: 3.6 mmol/L (ref 3.5–5.1)
SODIUM: 138 mmol/L (ref 135–145)

## 2018-05-02 LAB — URINALYSIS, MICROSCOPIC (REFLEX)

## 2018-05-02 LAB — URINALYSIS, ROUTINE W REFLEX MICROSCOPIC
BILIRUBIN URINE: NEGATIVE
Glucose, UA: NEGATIVE mg/dL
KETONES UR: NEGATIVE mg/dL
LEUKOCYTES UA: NEGATIVE
NITRITE: NEGATIVE
Protein, ur: NEGATIVE mg/dL
SPECIFIC GRAVITY, URINE: 1.02 (ref 1.005–1.030)
pH: 6.5 (ref 5.0–8.0)

## 2018-05-02 LAB — CBC
HCT: 41.1 % (ref 36.0–46.0)
Hemoglobin: 13.5 g/dL (ref 12.0–15.0)
MCH: 28.8 pg (ref 26.0–34.0)
MCHC: 32.8 g/dL (ref 30.0–36.0)
MCV: 87.6 fL (ref 80.0–100.0)
NRBC: 0 % (ref 0.0–0.2)
Platelets: 198 10*3/uL (ref 150–400)
RBC: 4.69 MIL/uL (ref 3.87–5.11)
RDW: 12.4 % (ref 11.5–15.5)
WBC: 7.6 10*3/uL (ref 4.0–10.5)

## 2018-05-02 LAB — HEPATIC FUNCTION PANEL
ALT: 5 U/L (ref 0–44)
AST: 16 U/L (ref 15–41)
Albumin: 4.5 g/dL (ref 3.5–5.0)
Alkaline Phosphatase: 67 U/L (ref 38–126)
BILIRUBIN DIRECT: 0.1 mg/dL (ref 0.0–0.2)
BILIRUBIN TOTAL: 0.9 mg/dL (ref 0.3–1.2)
Indirect Bilirubin: 0.8 mg/dL (ref 0.3–0.9)
Total Protein: 7.3 g/dL (ref 6.5–8.1)

## 2018-05-02 LAB — LIPASE, BLOOD: Lipase: 28 U/L (ref 11–51)

## 2018-05-02 MED ORDER — SODIUM CHLORIDE 0.9 % IV BOLUS
500.0000 mL | Freq: Once | INTRAVENOUS | Status: AC
  Administered 2018-05-02: 500 mL via INTRAVENOUS

## 2018-05-02 NOTE — ED Provider Notes (Signed)
[facility] EMERGENCY DEPARTMENT Provider Note   CSN: [id number] Arrival date & time: 05/02/18  1116     History   Chief Complaint Chief Complaint  Patient presents with  . Dizziness    HPI Lindsay Johnson is a 57 y.o. female with an extensive past medical history who presents the emergency department with episodes of flushing and nausea.  8 days ago burning from the airport.  She had about 15 minutes of feeling lightheaded, facial flushing and associated nausea.  She has had about 4-5 episodes a day for the past 8 days, none of them has been as severe as the first event.  She denies chest pain, shortness of breath, vomiting, vertigo, severe headache, changes in vision, unilateral weakness, difficulty with speech or swallowing.  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  She also had a positive thyroperoxidase elevation and was started on levothyroxine but stopped taking this medication days ago.  She had had one episode this morning and decided to come in for evaluation.  She denies palpitations, racing or skipping in her heart during the event.  Including thyroid studies which are negative except as mentioned above.  HPI  Past Medical History:  Diagnosis Date  . Anxiety   . Colon cancer ([place])   . GERD (gastroesophageal reflux disease)   . Hyperlipemia   . Interstitial cystitis   . Tremor     Patient Active Problem List   Diagnosis Date Noted  . 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 Dysfunction of eustachian tube 05/15/2015  . Chronic interstitial cystitis 05/15/2015  . Hypercholesterolemia 05/15/2015  . HLD (hyperlipidemia) 05/15/2015  . Cannot sleep 05/15/2015  . H/O malignant neoplasm of rectum, rectosigmoid junction, and anus 05/15/2015  . Neurocardiogenic syncope 05/15/2015  . Colon cancer high risk 02/21/2013  . 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 Excessive lacrimation  04/20/2012  . Allergic rhinitis 09/27/2011  . Chronic mastoiditis 06/08/2011  . Conductive hearing loss in right ear 06/08/2011    Past Surgical History:  Procedure Laterality Date  . COLECTOMY  2005  . INNER EAR SURGERY     multiple ear surgeries per patient     OB History   None      Home Medications    Prior to Admission medications   Medication Sig Start Date End Date Taking? Authorizing Provider  carbidopa-levodopa (SINEMET IR) 25-100 MG tablet Take 1 tablet by mouth 3 (three) times daily.   Yes [provider]  cholecalciferol (VITAMIN D) 1000 units tablet Take 1,000 Units daily by mouth. 2000 units daily   Yes [provider]  DULoxetine (CYMBALTA) 20 MG capsule Take 20 mg by mouth daily.   Yes [provider]  pentosan polysulfate (ELMIRON) 100 MG capsule Take 100 mg by mouth daily. 07/03/10  Yes [provider]  atorvastatin (LIPITOR) 20 MG tablet Take 10 mg by mouth daily. Breaks 20mg  tab in 1/2. 11/19/14   [provider]  Calcium Glycerophosphate (PRELIEF PO) Take daily by mouth.    [provider]  co-enzyme Q-10 30 MG capsule Take 30 mg by mouth 3 (three) times daily.    [provider]  cyanocobalamin 1000 MCG tablet Take 1,000 mcg daily by mouth.    [provider]  propranolol (INDERAL) 20 MG tablet Take 20 mg 2 (two) times daily by mouth.    [provider]  sertraline (ZOLOFT) 100 MG  tablet Take 150 mg by mouth daily.    [provider]    Family History Family History  Problem Relation Age of Onset  . Other Mother        ETOH  . Heart disease Father   . Hyperlipidemia Father   . Hypertension Sister   . Hypertension Brother     Social History Social History   Tobacco Use  . Smoking status: Never Smoker  . Smokeless tobacco: Never Used  Substance Use Topics  . Alcohol use: Yes    Comment: occ  . Drug use: No     Allergies   Codeine; Sulfa antibiotics;  Bupropion; and Valium [diazepam]   Review of Systems Review of Systems  Ten systems reviewed and are negative for acute change, except as noted in the HPI.   Physical Exam Updated Vital Signs BP 129/82   Pulse 74   Temp 98.6 F (37 C) (Oral)   Resp 19   Ht 5\' 4"  (1.626 m)   Wt 81.2 kg   SpO2 98%   BMI 30.73 kg/m   Physical Exam  Physical Exam  Nursing note and vitals reviewed. Constitutional: She is oriented to person, place, and time. She appears well-developed and well-nourished. No distress.  HENT:  Head: Normocephalic and atraumatic.  Eyes: Conjunctivae normal and EOM are normal. Pupils are equal, round, and reactive to light. No scleral icterus.  Neck: Normal range of motion.  Cardiovascular: Normal rate, regular rhythm and normal heart sounds.  Exam reveals no gallop and no friction rub.   No murmur heard. Pulmonary/Chest: Effort normal and breath sounds normal. No respiratory distress.  Abdominal: Soft. Bowel sounds are normal. She exhibits no distension and no mass. There is no tenderness. There is no guarding.  Neurological: She is alert and oriented to person, place, and time.  Speech is clear and goal oriented, follows commands Major Cranial nerves without deficit, no facial droop Normal strength in upper and lower extremities bilaterally including dorsiflexion and plantar flexion, strong and equal grip strength Sensation normal to light and sharp touch Moves extremities without ataxia, coordination intact Normal finger to nose and rapid alternating movements Neg romberg, no pronator drift Normal gait Normal heel-shin and balance Skin: Skin is warm and dry. She is not diaphoretic.    ED Treatments / Results  Labs (all labs ordered are listed, but only abnormal results are displayed) Labs Reviewed  BASIC METABOLIC PANEL - Abnormal; Notable for the following components:      Result Value   Glucose, Bld 122 (*)    BUN 21 (*)    All other components  within normal limits  URINALYSIS, ROUTINE W REFLEX MICROSCOPIC - Abnormal; Notable for the following components:   Hgb urine dipstick SMALL (*)    All other components within normal limits  URINALYSIS, MICROSCOPIC (REFLEX) - Abnormal; Notable for the following components:   Bacteria, UA RARE (*)    All other components within normal limits  CBC  HEPATIC FUNCTION PANEL  LIPASE, BLOOD    EKG EKG Interpretation  Date/Time:  Tuesday May 02 2018 11:52:10 EDT Ventricular Rate:  82 PR Interval:    QRS Duration: 86 QT Interval:  352 QTC Calculation: 412 R Axis:   85 Text Interpretation:  Sinus rhythm No old tracing to compare Confirmed by Deno Etienne [phone]) on 05/02/2018 12:00:58 PM   Radiology No results found.  Procedures Procedures (including critical care time)  Medications Ordered in ED Medications  sodium chloride 0.9 %  bolus 500 mL (0 mLs Intravenous Stopped 05/02/18 1533)     Initial Impression / Assessment and Plan / ED Course  I have reviewed the triage vital signs and the nursing notes.  Pertinent labs & imaging results that were available during my care of the patient were reviewed by me and considered in my medical decision making (see chart for details).     Patient here with complaint of flushing, nausea and lightheadedness lasting up to 15 minutes at a time for the past several days.  She is on Sinemet and recently started on Cymbalta either of which in the list of adverse effects could be causing the symptoms she is a complaining of.  Has a mildly elevated blood glucose today at 122.  Her lipase and hepatic function panel are normal.  Her CBC is also within normal limits.  UA appears contaminated without evidence of infection.  Her EKG shows normal sinus rhythm without arrhythmias, signs of WPW, Brugada or heart block.  His orthostatics vital signs are positive and she was given a liter of fluid with improvement in her symptoms.  Patient has multiple  medications and her symptoms may be a result of polypharmacy or hormonal changes given she is the age where she might be having menopause symptoms.  She does not have periods because of her uterine ablation 10 years ago.  Patient does not appear to have an emergent cause of her symptoms.  Patient appears appropriate for discharge at this time. Final Clinical Impressions(s) / ED Diagnoses   Final diagnoses:  Orthostatic dizziness    ED Discharge Orders    None       Margarita Mail, PA-C 05/02/18 2005    Little, Wenda Overland, MD 05/02/18 2356

## 2018-05-02 NOTE — ED Triage Notes (Signed)
Pt c/o dizziness, fatigue, HA, nausea x 8 days-pt states she has been going through testing for "internal tremors" and balance issues-pt taking parkinsons meds however has not been dx with parkinsons-being followed by neuro and PCP-pt NAD-steady gait

## 2018-05-02 NOTE — ED Notes (Signed)
Pt verbalizes understanding of d/c instructions and denies any further needs at this time. 

## 2018-05-02 NOTE — ED Notes (Signed)
Pt reports sudden onset of dizziness, nausea, feeling flushed, fatigue and body aches 1 week ago. States she has been feeling bad since then. Several days before this started she stopped taking several of her rx including zetia, synthroid, and vesicare.

## 2018-05-02 NOTE — Discharge Instructions (Addendum)
Get help right away if: You have chest pain. You have a fast or irregular heartbeat. You develop numbness in any part of your body. You cannot move your arms or your legs. You have trouble speaking. You become sweaty or feel lightheaded. You faint. You feel short of breath. You have trouble staying awake. You feel confused.

## 2018-05-03 NOTE — Progress Notes (Signed)
Subjective:   Lindsay Johnson was seen in consultation in the movement disorder clinic at the request of Dr. Martha Clan.  Her PCP is System, Pcp Not In.  The evaluation is for tremor and balance change.   The records that were made available to me were reviewed.  She has seen 2 other neurologists for the same.  Pt saw Dr. Linus Salmons in 08/2016.  He thought she possibly had a essential tremor, but given her complaints to him of dizziness and cognitive change, he did an MRI of the brain.  This was completed in March, 2018.  I only have the reports, but not the films.  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  He started her on propranolol, 80 mg at follow-up and told her to return in 6 months. She states that she was only started on 20 mg and it helped but then it quit working so she increased to 20 mg bid.  It is not working as well now.  She notes it is lowering her BP but she is asymptommatic with that.   She saw a different neurologist in 05/2017, Dr. Nestor Ramp.  Sleep study was recommended for her complaints of fatigue and cognitive change.  It appears anxiety was also felt to be playing a role.  Repeat MRI was recommended in 6 months to 1 year.  She states that sx's are getting worse.  She cannot walk a straight line.  She feels like she has inner tremor, as well as external tremor.  She has trouble with coordination issues like washing her hair, putting on and off her coat, getting in and out of car and "I feel like I have no muscle strength."  Some paresthesias in all of the fingers on all of the hands.  She admits to neck pain since November.  She has trouble checking her blind spot when driving.  She states that her urologist thinks that neck issues have to do with urinary hesitancy, frequency and urgency.     Tremor started approximately 1 year ago and involves the bilateral UE.  Tremor is most noticeable when using the hands.   There is no family hx of tremor.     Affected by caffeine:  unknown (only drinks decaf coffee) Affected by alcohol:  (2-3 glasses wine/day on weekend and makes tremor and balance worse) Affected by stress:  Yes.   Affected by fatigue:  No. Spills soup if on spoon:  No., but she can feel the tremor Spills glass of liquid if full:  No. Affects ADL's (tying shoes, brushing teeth, etc):  No., but does have some trouble with flossing teeth and with handwriting  Current/Previously tried tremor medications: propranolol  Current medications that may exacerbate tremor:  n/a  Outside reports reviewed: historical medical records, lab reports, office notes and referral letter/letters.  Living situation:  Pt lives with their spouse.  The patient does not do the finances in the home. Husband takes care of finances.   The patient does drive but sometimes she feels that she has a "brain fog" when she drives.  Fatigue got better when she d/c ambien but the brain fog didn't.  The patient does cook.  There is no difficulty remembering common recipes.  The stovetop has rarely been left on accidentally.  ADL's:  The patient is  able to perform her own ADL's. The patient self medicates.The patients bladder and bowel are under good control.   Behavior:  There have been no behavioral changes over the years.  On zoloft and doing well  05/05/18 update: Patient is seen today in follow-up for complaints of gait change and tremor.  She had multiple studies completed after our last visit.  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  MRI of the cervical spine demonstrate a mild degenerative changes.  She had an EMG that was normal.    She subsequently sought an opinion at the [place], where she moved for a period of time (she lives there in the summer).  I have reviewed those records.  She saw a Dr. Pati Gallo, who is an internist. In July, 2019, her internist placed her on  levodopa.  Dr. Thurmond Butts did state that "I am not sure this is truly Parkinson's."  Pt states that when she first went on it, "it worked really well."  However, as of beginning of October it wasn't working well.  Then on 10/21, she had gone to [facility] and just got off of the plane and had rapid onset of flushing and nausea.  She went to PCP and had some labs and told she would do a CT abdomen due to constipation.  Ever since this issue, her levodopa has not worked well.  She did got to UC and was told she had OH.  She had a few falls when in [place] over the summer.  With one she was pulling rhubarb out of the ground and fell when pulling.  With one, she was carrying a large pot and got off balance and fell.  She is off of levodopa and last took it at 7:30 pm.  She usually takes it tid at 7:30/1:30 and 7:30pm.    She can no longer ride a bike.  She can't swim.     Allergies  Allergen Reactions  . Codeine     Makes Pt very "hyper"  . Sulfa Antibiotics     rash  . Bupropion Other (See Comments)  . Valium [Diazepam]     hyperactive    Outpatient Encounter Medications as of 05/05/2018  Medication Sig  . carbidopa-levodopa (SINEMET IR) 25-100 MG tablet Take 1 tablet by mouth 3 (three) times daily.  . cholecalciferol (VITAMIN D) 1000 units tablet Take 1,000 Units daily by mouth. 2000 units daily  . cyanocobalamin 1000 MCG tablet Take 1,000 mcg daily by mouth.  . DULoxetine (CYMBALTA) 20 MG capsule Take 20 mg by mouth daily.  . pentosan polysulfate (ELMIRON) 100 MG capsule Take 100 mg by mouth daily.  . ranitidine (ZANTAC) 150 MG tablet Take 150 mg by mouth 2 (two) times daily.  . [DISCONTINUED] atorvastatin (LIPITOR) 20 MG tablet Take 10 mg by mouth daily. Breaks 20mg  tab in 1/2.  . [DISCONTINUED] Calcium Glycerophosphate (PRELIEF PO) Take daily by mouth.  . [DISCONTINUED] co-enzyme Q-10 30 MG capsule Take 30 mg by mouth 3 (three) times daily.  . [DISCONTINUED] propranolol (INDERAL) 20 MG tablet Take 20 mg 2  (two) times daily by mouth.  . [DISCONTINUED] sertraline (ZOLOFT) 100 MG tablet Take 150 mg by mouth daily.   No facility-administered encounter medications on file as of 05/05/2018.     Past Medical History:  Diagnosis Date  . Anxiety   . Colon cancer ([place])   . GERD (gastroesophageal reflux disease)   . Hyperlipemia   . Interstitial cystitis   . Tremor     Past Surgical History:  Procedure Laterality Date  . COLECTOMY  2005  . INNER EAR SURGERY     multiple ear surgeries per patient    Social History   Socioeconomic History  . Marital status: Married    Spouse name: Not on file  . Number of children: Not on file  . Years of education: Not on file  . Highest education level: Not on file  Occupational History  . Occupation: Print production planner  Social Needs  . Financial resource strain: Not on file  . Food insecurity:    Worry: Not on file    Inability: Not on file  . Transportation needs:    Medical: Not on file    Non-medical: Not on file  Tobacco Use  . Smoking status: Never Smoker  . Smokeless tobacco: Never Used  Substance and Sexual Activity  . Alcohol use: Yes    Comment: occ  . Drug use: No  . Sexual activity: Not on file  Lifestyle  . Physical activity:    Days per week: Not on file    Minutes per session: Not on file  . Stress: Not on file  Relationships  . Social connections:    Talks on phone: Not on file    Gets together: Not on file    Attends religious service: Not on file    Active member of club or organization: Not on file    Attends meetings of clubs or organizations: Not on file    Relationship status: Not on file  . Intimate partner violence:    Fear of current or ex partner: Not on file    Emotionally abused: Not on file    Physically abused: Not on file    Forced sexual activity: Not on file  Other Topics Concern  . Not on file  Social History Narrative  . Not on file    Family Status  Relation Name Status  . Mother   Deceased  . Father  Alive  . Sister x2 Alive  . Brother  Alive  . Son  Alive  . Daughter  Alive    Review of Systems Review of Systems  Constitutional: Negative.   HENT: Negative.   Eyes: Negative.        C/o L eye twitching  Cardiovascular: Negative.   Gastrointestinal: Negative.   Skin: Negative.      Objective:   VITALS:   Vitals:   05/05/18 1039  BP: 104/70  Pulse: 84  SpO2: 99%  Weight: 179 lb (81.2 kg)  Height: 5\' 4"  (1.626 m)   GEN:  The patient appears stated age and is in NAD. HEENT:  Normocephalic, atraumatic.  The mucous membranes are moist. The superficial temporal arteries are without ropiness or tenderness. CV:  RRR Lungs:  CTAB Neck/HEME:  There are no carotid bruits bilaterally.  Neck is slightly turned to the left  Neurological examination:  Orientation: The patient is alert and oriented x3. Cranial nerves: There is good facial symmetry. Mild facial hypomimia.  The speech is fluent and clear. Soft palate rises symmetrically and there is no tongue deviation. Hearing is intact to conversational tone. Sensation: Sensation is intact to light touch throughout Motor: Strength is 5/5 in the bilateral upper and lower extremities.   Shoulder shrug is equal and symmetric.  There is no pronator drift.  Movement examination: Tone: There is mild increased tone in the RUE.   Abnormal movements: There is tremor that can be felt in the RUE  Coordination:  There  is decremation with RAM's, with any form of RAMS, including alternating supination and pronation of the forearm, hand opening and closing, finger taps, heel taps and toe taps on the right.  Gait and Station: The patient has no difficulty arising out of a deep-seated chair without the use of the hands. The patient's stride length is good.  She doesn't use arms at all (or even flex them) when asked to run (somewhat nonphysiologic).    Lab Results  Component Value Date   WBC 7.6 05/02/2018   HGB 13.5  05/02/2018   HCT 41.1 05/02/2018   MCV 87.6 05/02/2018   PLT 198 05/02/2018     Chemistry      Component Value Date/Time   NA 138 05/02/2018 1156   NA 143 05/09/2017 1343   NA 140 05/13/2016 1256   K 3.6 05/02/2018 1156   K 3.7 05/09/2017 1343   K 3.7 05/13/2016 1256   CL 103 05/02/2018 1156   CL 104 05/09/2017 1343   CO2 27 05/02/2018 1156   CO2 28 05/09/2017 1343   CO2 22 05/13/2016 1256   BUN 21 (H) 05/02/2018 1156   BUN 17 05/09/2017 1343   BUN 17.2 05/13/2016 1256   CREATININE 0.62 05/02/2018 1156   CREATININE 0.9 05/09/2017 1343   CREATININE 0.7 05/13/2016 1256      Component Value Date/Time   CALCIUM 9.5 05/02/2018 1156   CALCIUM 9.7 05/09/2017 1343   CALCIUM 9.2 05/13/2016 1256   ALKPHOS 67 05/02/2018 1156   ALKPHOS 64 05/09/2017 1343   ALKPHOS 81 05/13/2016 1256   AST 16 05/02/2018 1156   AST 19 05/09/2017 1343   AST 16 05/13/2016 1256   ALT 5 05/02/2018 1156   ALT 24 05/09/2017 1343   ALT 20 05/13/2016 1256   BILITOT 0.9 05/02/2018 1156   BILITOT 0.80 05/09/2017 1343   BILITOT 0.54 05/13/2016 1256           Assessment/Plan:   1.  Parkinsonism  -Patient definitely looks more parkinsonian than she did a year ago, and I think even an atypical state is in the differential (particularly MSA).  I am going to do a DaTscan.  I did not take her off of the levodopa, but I told her to move the dosages closer together so that she is taking it at 8 AM/noon/4 PM.  Discussed in detail with her that the DaTscan is not a diagnostic scan but rather an informative scan.  Discussed differential with her.  Discussed safety.  2.  Constipation  -Discussed hydration  -Just saw primary care for the same and is being worked up on a much greater scale.  Is to have a CT of the abdomen soon.  3.  F/u after above testing.  Much greater than 50% of this visit was spent in counseling and coordinating care.  Total face to face time:  25 min  CC:  System, Pcp Not In

## 2018-05-05 ENCOUNTER — Encounter

## 2018-05-05 ENCOUNTER — Encounter: Payer: Self-pay | Admitting: Neurology

## 2018-05-05 ENCOUNTER — Ambulatory Visit: Payer: 59 | Admitting: Neurology

## 2018-05-05 VITALS — BP 104/70 | HR 84 | Ht 64.0 in | Wt 179.0 lb

## 2018-05-05 DIAGNOSIS — G20 Parkinson's disease: Secondary | ICD-10-CM

## 2018-05-05 DIAGNOSIS — R251 Tremor, unspecified: Secondary | ICD-10-CM | POA: Diagnosis not present

## 2018-05-05 DIAGNOSIS — G20C Parkinsonism, unspecified: Secondary | ICD-10-CM

## 2018-05-05 MED ORDER — CARBIDOPA-LEVODOPA 25-100 MG PO TABS: 1.0000 | ORAL_TABLET | Freq: Three times a day (TID) | ORAL | 1 refills | Status: DC

## 2018-05-06 ENCOUNTER — Encounter: Payer: Self-pay | Admitting: Hematology & Oncology

## 2018-05-07 ENCOUNTER — Encounter: Payer: Self-pay | Admitting: Hematology & Oncology

## 2018-05-08 ENCOUNTER — Inpatient Hospital Stay: Payer: 59

## 2018-05-08 ENCOUNTER — Other Ambulatory Visit: Payer: Self-pay | Admitting: *Deleted

## 2018-05-08 ENCOUNTER — Inpatient Hospital Stay: Payer: 59 | Attending: Hematology & Oncology | Admitting: Hematology & Oncology

## 2018-05-08 ENCOUNTER — Other Ambulatory Visit: Payer: Self-pay

## 2018-05-08 ENCOUNTER — Encounter: Payer: Self-pay | Admitting: Hematology & Oncology

## 2018-05-08 ENCOUNTER — Encounter: Payer: Self-pay | Admitting: *Deleted

## 2018-05-08 VITALS — BP 128/79 | HR 85 | Temp 97.8°F | Resp 18 | Wt 180.0 lb

## 2018-05-08 DIAGNOSIS — C185 Malignant neoplasm of splenic flexure: Secondary | ICD-10-CM

## 2018-05-08 DIAGNOSIS — Z79899 Other long term (current) drug therapy: Secondary | ICD-10-CM | POA: Insufficient documentation

## 2018-05-08 DIAGNOSIS — C187 Malignant neoplasm of sigmoid colon: Secondary | ICD-10-CM

## 2018-05-08 DIAGNOSIS — R55 Syncope and collapse: Secondary | ICD-10-CM

## 2018-05-08 DIAGNOSIS — D500 Iron deficiency anemia secondary to blood loss (chronic): Secondary | ICD-10-CM

## 2018-05-08 DIAGNOSIS — N951 Menopausal and female climacteric states: Secondary | ICD-10-CM

## 2018-05-08 DIAGNOSIS — D510 Vitamin B12 deficiency anemia due to intrinsic factor deficiency: Secondary | ICD-10-CM

## 2018-05-08 DIAGNOSIS — Z9189 Other specified personal risk factors, not elsewhere classified: Secondary | ICD-10-CM

## 2018-05-08 DIAGNOSIS — C189 Malignant neoplasm of colon, unspecified: Secondary | ICD-10-CM

## 2018-05-08 LAB — CBC WITH DIFFERENTIAL (CANCER CENTER ONLY)
ABS IMMATURE GRANULOCYTES: 0.02 10*3/uL (ref 0.00–0.07)
BASOS PCT: 0 %
Basophils Absolute: 0 10*3/uL (ref 0.0–0.1)
Eosinophils Absolute: 0 10*3/uL (ref 0.0–0.5)
Eosinophils Relative: 0 %
HCT: 42 % (ref 36.0–46.0)
HEMOGLOBIN: 13.4 g/dL (ref 12.0–15.0)
IMMATURE GRANULOCYTES: 0 %
Lymphocytes Relative: 17 %
Lymphs Abs: 1.1 10*3/uL (ref 0.7–4.0)
MCH: 28.1 pg (ref 26.0–34.0)
MCHC: 31.9 g/dL (ref 30.0–36.0)
MCV: 88.1 fL (ref 80.0–100.0)
MONO ABS: 0.5 10*3/uL (ref 0.1–1.0)
MONOS PCT: 7 %
NRBC: 0 % (ref 0.0–0.2)
Neutro Abs: 5.1 10*3/uL (ref 1.7–7.7)
Neutrophils Relative %: 76 %
PLATELETS: 205 10*3/uL (ref 150–400)
RBC: 4.77 MIL/uL (ref 3.87–5.11)
RDW: 12.6 % (ref 11.5–15.5)
WBC: 6.7 10*3/uL (ref 4.0–10.5)

## 2018-05-08 LAB — CMP (CANCER CENTER ONLY)
ALBUMIN: 3.9 g/dL (ref 3.5–5.0)
ALK PHOS: 74 U/L (ref 26–84)
ALT: 14 U/L (ref 10–47)
AST: 17 U/L (ref 11–38)
Anion gap: 7 (ref 5–15)
BUN: 16 mg/dL (ref 7–22)
CALCIUM: 10.1 mg/dL (ref 8.0–10.3)
CO2: 29 mmol/L (ref 18–33)
CREATININE: 0.8 mg/dL (ref 0.60–1.20)
Chloride: 106 mmol/L (ref 98–108)
GLUCOSE: 111 mg/dL (ref 73–118)
Potassium: 4 mmol/L (ref 3.3–4.7)
SODIUM: 142 mmol/L (ref 128–145)
TOTAL PROTEIN: 7 g/dL (ref 6.4–8.1)
Total Bilirubin: 1.1 mg/dL (ref 0.2–1.6)

## 2018-05-08 LAB — VITAMIN B12: VITAMIN B 12: 651 pg/mL (ref 180–914)

## 2018-05-08 LAB — CEA (IN HOUSE-CHCC): CEA (CHCC-In House): 1.58 ng/mL (ref 0.00–5.00)

## 2018-05-08 MED ORDER — METOCLOPRAMIDE HCL 10 MG PO TABS: 10.0000 mg | ORAL_TABLET | Freq: Four times a day (QID) | ORAL | 3 refills | Status: DC | PRN

## 2018-05-08 MED FILL — METOCLOPRAMIDE 10 MG TABLET: 10 | 23 days supply | Qty: 90 | Fill #0

## 2018-05-08 NOTE — Addendum Note (Signed)
Addended by: Burney Gauze R on: 05/08/2018 11:58 AM   Modules accepted: Orders

## 2018-05-08 NOTE — Progress Notes (Signed)
Hematology and Oncology Follow Up Visit  Lindsay Johnson [id number] [birth year] 57 y.o. 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  Unfortunately, she still is having problems.  I am still not sure as to what could be going on.  She is being seen by neurology.  She is being seen by her family doctor.  She may have Parkinson's.  She is to undergo a special MRI for her brain.  She apparently had an episode of flushing nausea and lightheadedness a week or so ago.  I do not know if she has pheochromocytoma.  I think we probably need to check this out.  I we will get a CT scan of her body.  I want to make sure that there is nothing going on with respect to her colon cancer coming back which I would think would be highly unlikely.  There is been no bleeding.  She has had no cough.  She has had no weight loss.  Her thyroid has been checked.  She has had a very thorough work-up to date.   She does have a positive ANA.  This was a year ago.  I am not sure exactly what this signifies.  She has had no mouth sores.  Her daughter is getting married in October 2020.  It would be nice to make sure that she is healthy so that she can enjoy the wedding.   Overall, her performance status is ECOG 1.    Medications:  Current Outpatient Medications:  .  carbidopa-levodopa (SINEMET IR) 25-100 MG tablet, Take 1 tablet by mouth 3 (three) times daily., Disp: 270 tablet, Rfl: 1 .  cholecalciferol (VITAMIN D) 1000 units tablet, Take 1,000 Units daily by mouth. 2000 units daily, Disp: , Rfl:  .  cyanocobalamin 1000 MCG tablet, Take 1,000 mcg daily by mouth., Disp: , Rfl:  .  DULoxetine (CYMBALTA) 20 MG capsule, Take 20 mg by mouth every other day. , Disp: , Rfl:  .  pentosan polysulfate (ELMIRON) 100 MG capsule, Take 100 mg by mouth daily., Disp: , Rfl:  .   ranitidine (ZANTAC) 150 MG tablet, Take 150 mg by mouth 2 (two) times daily., Disp: , Rfl:   Allergies:  Allergies  Allergen Reactions  . Codeine     Makes Pt very "hyper"  . Sulfa Antibiotics     rash  . Bupropion Other (See Comments)  . Valium [Diazepam]     hyperactive    Past Medical History, Surgical history, Social history, and Family History were reviewed and updated.  Review of System:   Review of Systems  Constitutional: Positive for malaise/fatigue.  HENT: Negative.   Eyes: Negative.   Respiratory: Negative.  Negative for cough.   Cardiovascular: Negative.   Gastrointestinal: Positive for heartburn and nausea.  Genitourinary: Negative.   Musculoskeletal: Positive for joint pain and myalgias.  Skin: Negative.   Neurological: Positive for tremors.  Endo/Heme/Allergies: Negative.   Psychiatric/Behavioral: Negative.      Physical Exam:  weight is 180 lb (81.6 kg). Her oral temperature is 97.8 F (36.6 C). Her blood pressure is 128/79 and her pulse is 85. Her respiration is 18 and oxygen saturation is 99%.   Wt Readings from Last 3 Encounters:  05/08/18 180 lb (81.6 kg)  05/05/18 179 lb (81.2 kg)  05/02/18 179 lb (81.2 kg)  Physical Exam  Constitutional: She is oriented to person, place, and time.  HENT:  Head: Normocephalic and atraumatic.  Mouth/Throat: Oropharynx is clear and moist.  Eyes: Pupils are equal, round, and reactive to light. EOM are normal.  Neck: Normal range of motion.  Cardiovascular: Normal rate, regular rhythm and normal heart sounds.  Pulmonary/Chest: Effort normal and breath sounds normal.  Abdominal: Soft. Bowel sounds are normal.  Musculoskeletal: Normal range of motion. She exhibits no edema, tenderness or deformity.  Lymphadenopathy:    She has no cervical adenopathy.  Neurological: She is alert and oriented to person, place, and time.  Skin: Skin is warm and dry. No rash noted. No erythema.  Psychiatric: She has a normal  mood and affect. Her behavior is normal. Judgment and thought content normal.  Vitals reviewed.    Lab Results  Component Value Date   WBC 6.7 05/08/2018   HGB 13.4 05/08/2018   HCT 42.0 05/08/2018   MCV 88.1 05/08/2018   PLT 205 05/08/2018     Chemistry      Component Value Date/Time   NA 142 05/08/2018 1020   NA 143 05/09/2017 1343   NA 140 05/13/2016 1256   K 4.0 05/08/2018 1020   K 3.7 05/09/2017 1343   K 3.7 05/13/2016 1256   CL 106 05/08/2018 1020   CL 104 05/09/2017 1343   CO2 29 05/08/2018 1020   CO2 28 05/09/2017 1343   CO2 22 05/13/2016 1256   BUN 16 05/08/2018 1020   BUN 17 05/09/2017 1343   BUN 17.2 05/13/2016 1256   CREATININE 0.80 05/08/2018 1020   CREATININE 0.9 05/09/2017 1343   CREATININE 0.7 05/13/2016 1256      Component Value Date/Time   CALCIUM 10.1 05/08/2018 1020   CALCIUM 9.7 05/09/2017 1343   CALCIUM 9.2 05/13/2016 1256   ALKPHOS 74 05/08/2018 1020   ALKPHOS 64 05/09/2017 1343   ALKPHOS 81 05/13/2016 1256   AST 17 05/08/2018 1020   AST 16 05/13/2016 1256   ALT 14 05/08/2018 1020   ALT 24 05/09/2017 1343   ALT 20 05/13/2016 1256   BILITOT 1.1 05/08/2018 1020   BILITOT 0.54 05/13/2016 1256         Impression and Plan: Lindsay Johnson is a 57 year old white female. She had stage II invasive colon cancer 14 years ago. She underwent resection. She underwent adjuvant chemotherapy with FOLFOX.  I am a little puzzled as to what might be going on.   I will get a 24-hour urine on her.  I will get a CT scan on her.  We will do our part to make sure there is nothing malignant that is happening.  I would like to see her back in a month.  This is quite complicated.  She was with me for about 35 minutes.  All the time spent face-to-face with her.  Lindsay Napoleon, MD 11/4/201911:45 AM

## 2018-05-09 ENCOUNTER — Encounter: Payer: Self-pay | Admitting: Hematology & Oncology

## 2018-05-10 ENCOUNTER — Ambulatory Visit (HOSPITAL_BASED_OUTPATIENT_CLINIC_OR_DEPARTMENT_OTHER): Payer: 59

## 2018-05-10 ENCOUNTER — Other Ambulatory Visit (HOSPITAL_BASED_OUTPATIENT_CLINIC_OR_DEPARTMENT_OTHER): Payer: Self-pay | Admitting: Nurse Practitioner

## 2018-05-10 ENCOUNTER — Other Ambulatory Visit: Payer: Self-pay | Admitting: *Deleted

## 2018-05-10 DIAGNOSIS — Z1231 Encounter for screening mammogram for malignant neoplasm of breast: Secondary | ICD-10-CM

## 2018-05-10 DIAGNOSIS — C189 Malignant neoplasm of colon, unspecified: Secondary | ICD-10-CM

## 2018-05-10 NOTE — Addendum Note (Signed)
Addended byAnnamaria Helling on: 05/10/2018 10:37 AM   Modules accepted: Orders

## 2018-05-11 ENCOUNTER — Ambulatory Visit (HOSPITAL_BASED_OUTPATIENT_CLINIC_OR_DEPARTMENT_OTHER): Payer: 59

## 2018-05-11 ENCOUNTER — Other Ambulatory Visit (HOSPITAL_BASED_OUTPATIENT_CLINIC_OR_DEPARTMENT_OTHER): Payer: 59

## 2018-05-12 ENCOUNTER — Ambulatory Visit: Payer: 59 | Admitting: Gastroenterology

## 2018-05-12 ENCOUNTER — Other Ambulatory Visit (INDEPENDENT_AMBULATORY_CARE_PROVIDER_SITE_OTHER): Payer: 59

## 2018-05-12 ENCOUNTER — Encounter: Payer: Self-pay | Admitting: Gastroenterology

## 2018-05-12 VITALS — BP 114/76 | HR 104 | Ht 64.0 in | Wt 178.1 lb

## 2018-05-12 DIAGNOSIS — Z85038 Personal history of other malignant neoplasm of large intestine: Secondary | ICD-10-CM

## 2018-05-12 DIAGNOSIS — K5900 Constipation, unspecified: Secondary | ICD-10-CM

## 2018-05-12 DIAGNOSIS — R109 Unspecified abdominal pain: Secondary | ICD-10-CM

## 2018-05-12 DIAGNOSIS — R110 Nausea: Secondary | ICD-10-CM

## 2018-05-12 LAB — H. PYLORI ANTIBODY, IGG: H Pylori IgG: NEGATIVE

## 2018-05-12 MED ORDER — ONDANSETRON HCL 4 MG PO TABS: 4.0000 mg | ORAL_TABLET | Freq: Four times a day (QID) | ORAL | 1 refills | Status: DC | PRN

## 2018-05-12 MED ORDER — SOD PICOSULFATE-MAG OX-CIT ACD 10-3.5-12 MG-GM -GM/160ML PO SOLN: 1.0000 | ORAL | 0 refills | Status: DC

## 2018-05-12 MED FILL — CLENPIQ 10-3.5-12 MG-GM -GM: 10-3.5-12 M | 2 days supply | Qty: 320 | Fill #0

## 2018-05-12 MED FILL — ONDANSETRON HCL 4 MG TABLET: 4 | 7 days supply | Qty: 30 | Fill #0

## 2018-05-12 NOTE — Progress Notes (Signed)
Chief Complaint: Abdominal pain, colon cancer surveillance   Referring Provider:     Ahmed Prima, MD ([facility])    HPI:     Lindsay Johnson is a 57 y.o. female with a history of colon cancer diagnosed 2004 status post resection and chemotherapy in 2005, movement disorder/parkinsonism referred to the Gastroenterology Clinic for evaluation of recent onset abdominal pain.    Today, she states abdominal pain started Oct 21 after flying back from [facility]. Abrupt onset facial flushing, abdominal pain, nausea, fatigue, lightheadedness, with tremors- seeing Neurology for Parkinson w/u.   Abdominal pain lower abdomen initially and now entire abdomen. Worse after eating, independent of types of food, occurring immediatley after eating. Pain present all day, but will worsen after PO. No nocturnal sxs. Constipation is new as well- baseline is approx 1 BM every 2 days, but now having difficulty having BM with straining. No appreciable change enemas, Miralax, prune juice. Was evaluated by her PCM on 05/05/2018 with abdominal x-ray reportedly notable for constipation.    Was seen in the [facility] ER on 05/09/2018.  Normal CBC, c-Met, UA.  CT was normal and prescribed Bentyl and Valium, continued Reglan and MiraLAX, and follow-up with primary care.  She additionally reports associated nausea without emesis.  Nausea not improved with Reglan. Decreased appetite and PO tolerance. 6# down in last 2 weeks from reduced appetite.  No prior similar symptoms.  History of colon cancer.  Diagnosed 06/2003 as stage II adenocarcinoma of the sigmoid colon treated in 2005 with left colectomy and chemotherapy by Dr. Marin Olp.  Was seen by GI at [facility] with colonoscopy in 08/2015 (she does not think polyps on this exam, but polyps on prior studies), and due 08/2018 for ongoing surveillance.   Hx of reflux that is generally well controlled with Zantac. No  prior EGD.   Endoscopic history: -Colonoscopy 03/2012: No report available -Colonoscopy 03/2013: No report available -Colonoscopy 08/2015: No report available   Past Medical History:  Diagnosis Date  . Anxiety   . Colon cancer ([place])   . GERD (gastroesophageal reflux disease)   . Hyperlipemia   . Interstitial cystitis   . Tremor      Past Surgical History:  Procedure Laterality Date  . COLECTOMY  2005  . INNER EAR SURGERY     multiple ear surgeries per patient   Family History  Problem Relation Age of Onset  . Other Mother        ETOH  . Heart disease Father   . Hyperlipidemia Father   . Hypertension Sister   . Hypertension Brother    Social History   Tobacco Use  . Smoking status: Never Smoker  . Smokeless tobacco: Never Used  Substance Use Topics  . Alcohol use: Yes    Comment: occ  . Drug use: No   Current Outpatient Medications  Medication Sig Dispense Refill  . carbidopa-levodopa (SINEMET IR) 25-100 MG tablet Take 1 tablet by mouth 3 (three) times daily. 270 tablet 1  . cholecalciferol (VITAMIN D) 1000 units tablet Take 1,000 Units daily by mouth. 2000 units daily    . cyanocobalamin 1000 MCG tablet Take 1,000 mcg daily by mouth.    . DULoxetine (CYMBALTA) 20 MG capsule Take 20 mg by mouth every other day.     . metoCLOPramide (REGLAN) 10 MG tablet Take 1 tablet (10 mg total) by mouth  every 6 (six) hours as needed for nausea. 90 tablet 3  . pentosan polysulfate (ELMIRON) 100 MG capsule Take 100 mg by mouth daily.    . ranitidine (ZANTAC) 150 MG tablet Take 150 mg by mouth 2 (two) times daily.     No current facility-administered medications for this visit.    Allergies  Allergen Reactions  . Codeine     Makes Pt very "hyper"  . Sulfa Antibiotics     rash  . Bupropion Other (See Comments)  . Valium [Diazepam]     hyperactive     Review of Systems: All systems reviewed and negative except where noted in HPI.     Physical Exam:    Wt Readings  from Last 3 Encounters:  05/08/18 180 lb (81.6 kg)  05/05/18 179 lb (81.2 kg)  05/02/18 179 lb (81.2 kg)    There were no vitals taken for this visit. Constitutional:  Pleasant, in no acute distress. Psychiatric: Normal mood and affect. Behavior is normal. EENT: Pupils normal.  Conjunctivae are normal. No scleral icterus. Neck supple. No cervical LAD. Cardiovascular: Normal rate, regular rhythm. No edema Pulmonary/chest: Effort normal and breath sounds normal. No wheezing, rales or rhonchi. Abdominal: Soft, nondistended, nontender. Bowel sounds active throughout. There are no masses palpable. No hepatomegaly. Neurological: Alert and oriented to person place and time. Skin: Skin is warm and dry. No rashes noted.   ASSESSMENT AND PLAN;   Lindsay Johnson is a 57 y.o. female presenting with:  1) Abdominal pain: Abrupt onset abdominal pain after returning from [place].  No tenderness to palpation on exam today.  Recent ER evaluation essentially unremarkable to include CBC, CMP, CT.  X-ray 4 days prior notable for stool burden.  Discussed the broad DDX for abdominal pain to include secondary to constipation, IBS (she does report symptoms tend to worsen as she is stressed), mucosal/luminal etiologies.  No prior history of DVT/PE, but temporal relationship to recent flight and prior history of malignancy does at least raise some suspicion.  However without TTP on exam and recent normal studies, vascular etiology lower on the DDX.  - We will evaluate with EGD and colonoscopy for luminal/mucosal etiology -H. pylori serology - Treat underlying constipation with MiraLAX, twice daily then titrate to effect with goal for regular soft stools without straining to have BM -Ensure adequate fluid intake -Resume Bentyl as needed - If above evaluation and treatment unrevealing/unsuccessful, can consider abdominal Doppler ultrasound versus CT angiogram  2) Nausea: New onset nausea in the setting of recent  abdominal pain.  Could also be secondary to above.  No improvement with trial of Reglan from ER.  Normal brain MRI earlier this year, and follows with neurology with repeat brain imaging scheduled for 12/19.  Will evaluate for etiology at time of EGD.  -EGD with random and directed biopsies -Zofran 4 mg as needed  3) Constipation: Decreased bowel frequency with straining to have BM since onset of abdominal pain.  Sounds more like because then affect.  Will treat constipation as noted above.  4) Personal history of CRC: History of CRC diagnosed in 2004 treated with surgical resection and chemotherapy in 2005.  Has remained up-to-date on surveillance colonoscopies, with last completed in 08/2015 with reported recommendation to repeat in 3 years.  Would be due in 08/2018, but as we are evaluating for etiology of constipation (to include postoperative, malignant etiology) as above, this should certainly serve as surveillance at this time.  The indications, risks, and benefits  of EGD and colonoscopy were explained to the patient in detail. Risks include but are not limited to bleeding, perforation, adverse reaction to medications, and cardiopulmonary compromise. Sequelae include but are not limited to the possibility of surgery, hositalization, and mortality. The patient verbalized understanding and wished to proceed. All questions answered, referred to scheduler and bowel prep ordered. Further recommendations pending results of the exam.     Dominic Pea Aleksis Jiggetts, DO, FACG  05/12/2018, 8:56 AM   No ref. provider found

## 2018-05-12 NOTE — Patient Instructions (Addendum)
If you are age 55 or older, your body mass index should be between 23-30. Your Body mass index is 30.58 kg/m. If this is out of the aforementioned range listed, please consider follow up with your Primary Care Provider.  If you are age 72 or younger, your body mass index should be between 19-25. Your Body mass index is 30.58 kg/m. If this is out of the aformentioned range listed, please consider follow up with your Primary Care Provider.   You have been scheduled for an endoscopy and colonoscopy. Please follow the written instructions given to you at your visit today. Please pick up your prep supplies at the pharmacy within the next 1-3 days. If you use inhalers (even only as needed), please bring them with you on the day of your procedure. Your physician has requested that you go to www.startemmi.com and enter the access code given to you at your visit today. This web site gives a general overview about your procedure. However, you should still follow specific instructions given to you by our office regarding your preparation for the procedure.  Please go to the lab on the 2nd floor [address] before you leave the office today.   We have sent the following medications to your pharmacy for you to pick up at your convenience: Zofran 4mg  by mouth every 6 hours for nausea. Clenpiq  Please purchase the following medications over the counter and take as directed: Ducolax   It was a pleasure to see you today!  Vito Cirigliano, D.O.

## 2018-05-15 ENCOUNTER — Telehealth: Payer: Self-pay | Admitting: Gastroenterology

## 2018-05-15 NOTE — Telephone Encounter (Signed)
Called and spoke with patient-patient reports abdominal pain is a "dull pain below my belly button and more pain that radiates above my belly button right after I eat or drink something"; pt reports taking her Bentyl at 7:00am, around 11:00am, and then again at 3:00pm and has been taking it since 05/09/18 without relief from pain, pt reports a bowel movement this am but still has pain and nausea ("Reglan and Zofran not working for my nausea that is being caused by my abdominal pain";)  Pt has not tried any Miralax and has just started the colonoscopy prep for procedure scheduled on Wednesday 05/17/18; Please advise;

## 2018-05-16 NOTE — Telephone Encounter (Signed)
Thank you for the message.  I will plan on going through potential treatment options with her in detail tomorrow at the time of her colonoscopy to see what we can do to better improve her GI symptoms, with additional recommendations potentially pending endoscopic findings.  Thank you.

## 2018-05-17 ENCOUNTER — Encounter: Payer: Self-pay | Admitting: Gastroenterology

## 2018-05-17 ENCOUNTER — Ambulatory Visit (AMBULATORY_SURGERY_CENTER): Payer: 59 | Admitting: Gastroenterology

## 2018-05-17 VITALS — BP 127/76 | HR 63 | Temp 97.5°F | Resp 15 | Ht 64.0 in | Wt 178.0 lb

## 2018-05-17 DIAGNOSIS — Z85038 Personal history of other malignant neoplasm of large intestine: Secondary | ICD-10-CM

## 2018-05-17 DIAGNOSIS — R109 Unspecified abdominal pain: Secondary | ICD-10-CM

## 2018-05-17 DIAGNOSIS — D120 Benign neoplasm of cecum: Secondary | ICD-10-CM

## 2018-05-17 DIAGNOSIS — D125 Benign neoplasm of sigmoid colon: Secondary | ICD-10-CM | POA: Diagnosis not present

## 2018-05-17 DIAGNOSIS — K2970 Gastritis, unspecified, without bleeding: Secondary | ICD-10-CM | POA: Diagnosis not present

## 2018-05-17 DIAGNOSIS — K5900 Constipation, unspecified: Secondary | ICD-10-CM

## 2018-05-17 DIAGNOSIS — D124 Benign neoplasm of descending colon: Secondary | ICD-10-CM | POA: Diagnosis present

## 2018-05-17 DIAGNOSIS — D123 Benign neoplasm of transverse colon: Secondary | ICD-10-CM

## 2018-05-17 DIAGNOSIS — R110 Nausea: Secondary | ICD-10-CM

## 2018-05-17 MED ORDER — HYOSCYAMINE SULFATE 0.125 MG SL SUBL: 0.1250 mg | SUBLINGUAL_TABLET | Freq: Four times a day (QID) | SUBLINGUAL | 3 refills | Status: DC | PRN

## 2018-05-17 MED ORDER — SODIUM CHLORIDE 0.9 % IV SOLN: 500.0000 mL | Freq: Once | INTRAVENOUS | Status: DC

## 2018-05-17 NOTE — Telephone Encounter (Signed)
MD to speak with pt prior to procedure this am;

## 2018-05-17 NOTE — Patient Instructions (Signed)
YOU HAD AN ENDOSCOPIC PROCEDURE TODAY AT [facility]:   Refer to the procedure report that was given to you for any specific questions about what was found during the examination.  If the procedure report does not answer your questions, please call your gastroenterologist to clarify.  If you requested that your care partner not be given the details of your procedure findings, then the procedure report has been included in a sealed envelope for you to review at your convenience later.  YOU SHOULD EXPECT: Some feelings of bloating in the abdomen. Passage of more gas than usual.  Walking can help get rid of the air that was put into your GI tract during the procedure and reduce the bloating. If you had a lower endoscopy (such as a colonoscopy or flexible sigmoidoscopy) you may notice spotting of blood in your stool or on the toilet paper. If you underwent a bowel prep for your procedure, you may not have a normal bowel movement for a few days.  Please Note:  You might notice some irritation and congestion in your nose or some drainage.  This is from the oxygen used during your procedure.  There is no need for concern and it should clear up in a day or so.  SYMPTOMS TO REPORT IMMEDIATELY:   Following lower endoscopy (colonoscopy or flexible sigmoidoscopy):  Excessive amounts of blood in the stool  Significant tenderness or worsening of abdominal pains  Swelling of the abdomen that is new, acute  Fever of 100F or higher   Following upper endoscopy (EGD)  Vomiting of blood or coffee ground material  New chest pain or pain under the shoulder blades  Painful or persistently difficult swallowing  New shortness of breath  Fever of 100F or higher  Black, tarry-looking stools  For urgent or emergent issues, a gastroenterologist can be reached at any hour by calling [phone].   DIET:  We do recommend a small meal at first, but then you may proceed to your regular diet.  Drink  plenty of fluids but you should avoid alcoholic beverages for 24 hours.  ACTIVITY:  You should plan to take it easy for the rest of today and you should NOT DRIVE or use heavy machinery until tomorrow (because of the sedation medicines used during the test).    FOLLOW UP: Our staff will call the number listed on your records the next business day following your procedure to check on you and address any questions or concerns that you may have regarding the information given to you following your procedure. If we do not reach you, we will leave a message.  However, if you are feeling well and you are not experiencing any problems, there is no need to return our call.  We will assume that you have returned to your regular daily activities without incident.  If any biopsies were taken you will be contacted by phone or by letter within the next 1-3 weeks.  Please call us at [phone] if you have not heard about the biopsies in 3 weeks.   Await for biopsy results Polyps (handout given) Olympus QuickClip Pro (card given) Repeat Colonoscopy in one year Office will call you to schedule a follow up appointment   SIGNATURES/CONFIDENTIALITY: You and/or your care partner have signed paperwork which will be entered into your electronic medical record.  These signatures attest to the fact that that the information above on your After Visit Summary has been reviewed and is understood.  Full responsibility of the confidentiality of this discharge information lies with you and/or your care-partner.

## 2018-05-17 NOTE — Op Note (Signed)
[facility] Patient Name: Labresha Mellor Procedure Date: 05/17/2018 8:25 AM MRN: [id number] Endoscopist: Gerrit Heck , MD Age: 57 Referring MD:  Date of Birth: [birth year] Gender: Female Account #: [id number] Procedure:                Upper GI endoscopy Indications:              Generalized abdominal pain, Nausea Medicines:                Monitored Anesthesia Care Procedure:                Pre-Anesthesia Assessment:                           - Prior to the procedure, a History and Physical                            was performed, and patient medications and                            allergies were reviewed. The patient's tolerance of                            previous anesthesia was also reviewed. The risks                            and benefits of the procedure and the sedation                            options and risks were discussed with the patient.                            All questions were answered, and informed consent                            was obtained. Prior Anticoagulants: The patient has                            taken no previous anticoagulant or antiplatelet                            agents. ASA Grade Assessment: II - A patient with                            mild systemic disease. After reviewing the risks                            and benefits, the patient was deemed in                            satisfactory condition to undergo the procedure.                           After obtaining informed consent, the endoscope was  passed under direct vision. Throughout the                            procedure, the patient's blood pressure, pulse, and                            oxygen saturations were monitored continuously. The                            Endoscope was introduced through the mouth, and                            advanced to the second part of duodenum. The upper                            GI endoscopy was  accomplished without difficulty.                            The patient tolerated the procedure well. Scope In: Scope Out: Findings:                 The examined esophagus was normal.                           The gastroesophageal flap valve was visualized                            endoscopically and classified as Hill Grade I                            (prominent fold, tight to endoscope).                           Esophagogastric landmarks were identified: the                            Z-line was found at 40 cm, the gastroesophageal                            junction was found at 40 cm and the site of hiatal                            narrowing was found at 40 cm from the incisors.                           The entire examined stomach was normal. Biopsies                            were taken with a cold forceps for Helicobacter                            pylori testing. Estimated blood loss was minimal.  The duodenal bulb, first portion of the duodenum                            and second portion of the duodenum were normal.                            Biopsies for histology were taken with a cold                            forceps for evaluation of celiac disease. Estimated                            blood loss was minimal. Complications:            No immediate complications. Estimated Blood Loss:     Estimated blood loss was minimal. Impression:               - Normal esophagus.                           - Gastroesophageal flap valve classified as Hill                            Grade I (prominent fold, tight to endoscope).                           - Esophagogastric landmarks identified.                           - Normal stomach. Biopsied.                           - Normal duodenal bulb, first portion of the                            duodenum and second portion of the duodenum.                            Biopsied. Recommendation:           - Patient  has a contact number available for                            emergencies. The signs and symptoms of potential                            delayed complications were discussed with the                            patient. Return to normal activities tomorrow.                            Written discharge instructions were provided to the                            patient.                           -  Resume previous diet today.                           - Continue present medications.                           - Await pathology results. Gerrit Heck, MD 05/17/2018 9:20:24 AM

## 2018-05-17 NOTE — Op Note (Signed)
[facility] Patient Name: Lindsay Johnson Procedure Date: 05/17/2018 8:25 AM MRN: [id number] Endoscopist: Gerrit Heck , MD Age: 57 Referring MD:  Date of Birth: [birth year] Gender: Female Account #: [id number] Procedure:                Colonoscopy Indications:              High risk colon cancer surveillance: Personal                            history of colon cancer, Incidental - Generalized                            abdominal pain, Incidental - Constipation Medicines:                Monitored Anesthesia Care Procedure:                Pre-Anesthesia Assessment:                           - Prior to the procedure, a History and Physical                            was performed, and patient medications and                            allergies were reviewed. The patient's tolerance of                            previous anesthesia was also reviewed. The risks                            and benefits of the procedure and the sedation                            options and risks were discussed with the patient.                            All questions were answered, and informed consent                            was obtained. Prior Anticoagulants: The patient has                            taken no previous anticoagulant or antiplatelet                            agents. ASA Grade Assessment: II - A patient with                            mild systemic disease. After reviewing the risks                            and benefits, the patient was deemed in  satisfactory condition to undergo the procedure.                           After obtaining informed consent, the colonoscope                            was passed under direct vision. Throughout the                            procedure, the patient's blood pressure, pulse, and                            oxygen saturations were monitored continuously. The                            Colonoscope was  introduced through the anus and                            advanced to the the terminal ileum. The colonoscopy                            was performed without difficulty. The patient                            tolerated the procedure well. The quality of the                            bowel preparation was adequate. Scope In: 8:41:03 AM Scope Out: 9:13:27 AM Scope Withdrawal Time: 0 hours 30 minutes 26 seconds  Total Procedure Duration: 0 hours 32 minutes 24 seconds  Findings:                 The perianal and digital rectal examinations were                            normal.                           A 8 mm polyp was found in the cecum. The polyp was                            flat. The polyp was removed with a hot snare.                            Resection and retrieval were complete. To close a                            defect after polypectomy, one hemostatic clip was                            successfully placed. There was no bleeding at the                            end of the procedure.  A 8 mm polyp was found in the splenic flexure. The                            polyp was flat. The polyp was removed with a saline                            injection-lift (injected 2 cc saline) technique                            using a hot snare. Resection and retrieval were                            complete. Estimated blood loss was minimal.                           Two sessile polyps were found in the sigmoid colon                            and descending colon. The polyps were 3 to 4 mm in                            size. These polyps were removed with a cold snare.                            Resection and retrieval were complete. Estimated                            blood loss was minimal.                           There was evidence of a prior end-to-end                            colo-colonic anastomosis in the recto-sigmoid                             colon. This was patent and was characterized by                            healthy appearing mucosa. The anastomosis was                            traversed.                           The retroflexed view of the distal rectum and anal                            verge was normal and showed no anal or rectal                            abnormalities.  Retroflexion in the right colon was performed. The                            colon mucosa otherwise appeared normal throughout.                           The terminal ileum appeared normal. Complications:            No immediate complications. Estimated Blood Loss:     Estimated blood loss was minimal. Impression:               - One 8 mm polyp in the cecum, removed with a hot                            snare. Resected and retrieved. Clip was placed.                           - One 8 mm polyp at the splenic flexure, removed                            using injection-lift and a hot snare. Resected and                            retrieved.                           - Two 3 to 4 mm polyps in the sigmoid colon and in                            the descending colon, removed with a cold snare.                            Resected and retrieved.                           - Patent end-to-end colo-colonic anastomosis,                            characterized by healthy appearing mucosa.                           - The distal rectum and anal verge are normal on                            retroflexion view.                           - The examined portion of the ileum was normal. Recommendation:           - Patient has a contact number available for                            emergencies. The signs and symptoms of potential  delayed complications were discussed with the                            patient. Return to normal activities tomorrow.                            Written discharge instructions were  provided to the                            patient.                           - Resume previous diet today.                           - Continue present medications.                           - Await pathology results.                           - Repeat colonoscopy in 1 year for surveillance.                           - Return to GI clinic at appointment to be                            scheduled. Gerrit Heck, MD 05/17/2018 9:31:00 AM

## 2018-05-17 NOTE — Progress Notes (Signed)
Called to room to assist during endoscopic procedure.  Patient ID and intended procedure confirmed with present staff. Received instructions for my participation in the procedure from the performing physician.  

## 2018-05-17 NOTE — Progress Notes (Signed)
Report given to PACU, vss 

## 2018-05-18 ENCOUNTER — Telehealth: Payer: Self-pay

## 2018-05-18 ENCOUNTER — Telehealth: Payer: Self-pay | Admitting: Gastroenterology

## 2018-05-18 MED ORDER — DICYCLOMINE HCL 20 MG PO TABS: 20.0000 mg | ORAL_TABLET | Freq: Four times a day (QID) | ORAL | 1 refills | Status: DC

## 2018-05-18 MED FILL — DICYCLOMINE 20 MG TABLET: 20 | 23 days supply | Qty: 90 | Fill #0

## 2018-05-18 NOTE — Telephone Encounter (Signed)
  Follow up Call-  Call back number 05/17/2018  Post procedure Call Back phone  # [phone]  Permission to leave phone message Yes  Some recent data might be hidden     Patient questions:  Do you have a fever, pain , or abdominal swelling? No. Pain Score  0 *  Have you tolerated food without any problems? Yes.    Have you been able to return to your normal activities? Yes.    Do you have any questions about your discharge instructions: Diet   No. Medications  No. Follow up visit  No.  Do you have questions or concerns about your Care? No.  Actions: * If pain score is 4 or above: No action needed, pain <4.

## 2018-05-18 NOTE — Telephone Encounter (Signed)
Patient returned call to office; informed patient of follow up appt on 06/20/18 with Dr Bryan Lemma at 1:45 pm with arrival at 1:30 pm; patient verbalized understanding of information; patient informed to call back if questions/concerns arise;

## 2018-05-18 NOTE — Telephone Encounter (Signed)
Left message for patient to call back; Follow up appointment scheduled for 06/20/18 at 1:45 pm and arrival at 1:30 pm;

## 2018-05-18 NOTE — Telephone Encounter (Signed)
Left message for patient to cal back.

## 2018-05-19 ENCOUNTER — Telehealth: Payer: Self-pay | Admitting: Gastroenterology

## 2018-05-19 NOTE — Telephone Encounter (Signed)
Pt called to inform that she is still experiencing nausea and stomach pain despite taking medication. Wants to know if there is something else that she can do.

## 2018-05-19 NOTE — Telephone Encounter (Signed)
Pt is calling back about her abd pain and nausea

## 2018-05-22 ENCOUNTER — Telehealth: Payer: Self-pay | Admitting: Gastroenterology

## 2018-05-22 NOTE — Telephone Encounter (Signed)
Please see additional documentation

## 2018-05-22 NOTE — Telephone Encounter (Signed)
Pt states that meds given to her for stomach are not working.

## 2018-05-22 NOTE — Telephone Encounter (Signed)
Called and spoke with patient- patient reported nausea still present-no vomiting; abdominal pain-lack of appetite, "small rock in the pit of my stomach- when I eat I feel worse"; abdominal worse first thing in am upon awakening;  Taking Zofran- no help with nausea "feeling" Taking Hyoscyamine-no relief from symptoms-took this am with a Zofran; Taking Bentyl-no relief of symptoms Patient reports no complete bowel movement without diarrhea since egd/colon procedure-taking Benefiber with coffee every morning; Please advise as to your plan of care-patient states she does not like having to take more than one medication but is requesting a plan of care that is not "too involved";

## 2018-05-23 ENCOUNTER — Ambulatory Visit: Payer: 59 | Admitting: Physician Assistant

## 2018-05-23 ENCOUNTER — Encounter: Payer: Self-pay | Admitting: Gastroenterology

## 2018-05-23 ENCOUNTER — Encounter: Payer: Self-pay | Admitting: Hematology & Oncology

## 2018-05-23 ENCOUNTER — Encounter: Payer: Self-pay | Admitting: Physician Assistant

## 2018-05-23 VITALS — BP 112/72 | HR 112 | Ht 64.0 in | Wt 177.0 lb

## 2018-05-23 DIAGNOSIS — R1084 Generalized abdominal pain: Secondary | ICD-10-CM | POA: Diagnosis not present

## 2018-05-23 MED ORDER — OMEPRAZOLE 20 MG PO CPDR: 20.0000 mg | DELAYED_RELEASE_CAPSULE | Freq: Every day | ORAL | 3 refills | Status: DC

## 2018-05-23 MED FILL — OMEPRAZOLE 20 MG CPDR: 20 | 30 days supply | Qty: 30 | Fill #0

## 2018-05-23 MED FILL — buPROPion HCL ER (XL) 150 M: 150 | 30 days supply | Qty: 30 | Fill #0

## 2018-05-23 NOTE — Telephone Encounter (Signed)
Patient calling in to explain that abdominal pain/cramping is still bad, especially first thing in the am"; patient reports not taking Zofran or Hyoscyamine due to "they are not working"; Patient would know next medications options or does she need to come into the office?

## 2018-05-23 NOTE — Progress Notes (Signed)
Chief Complaint: Abdominal pain  HPI:     Lindsay Johnson is a 57 year old female with a past medical history as listed below, known to Dr. Bryan Lemma, who presents to clinic today with continued abdominal pain.    07/12/2017 seen in clinic by Dr. Bryan Lemma.  At that time, it was discussed she had a history of colon cancer diagnosed in 2004 s/p resection and chemotherapy in 2005, movement disorder/parkinsonism and is being seen for abdominal pain.  Describes pain started October 21 after flying back from [place] with abrupt onset of facial flushing, abdominal pain, nausea, fatigue, lightheadedness and tremors.  Abdominal pain is in the lower abdomen and now rating to the entire abdomen, worse after eating.  Also with a change to constipation with straining.  Abdominal x-ray 05/05/2018 showed constipation.  Also seen at [facility] ER 05/09/2018 with normal CBC, CMP, UA and CT.  Also reported nausea with no vomiting.  Reglan was not helping.  At that time, it was discussed that it could be secondary to constipation, IBS or mucosal/luminal etiologies.  She was scheduled for an EGD and colonoscopy as well as H. pylori serology and her underlying constipation was treated with MiraLAX.  Recommend she resume Bentyl, as discussed with this is unrevealing/unsuccessful could consider abdominal Doppler ultrasound or CT angiogram.    05/17/2018 colonoscopy with one 8 mm polyp in the cecum, one 8 mm polyp at the splenic flexure, two 3-4 mm polyps in the sigmoid and descending colon and a patent end-to-end colocolonic anastomosis characterized by healthy-appearing mucosa, repeat colonoscopy was recommended in 1 year.  Pathology showed tubular adenomas.  EGD revealed normal esophagus, gastroesophageal flap valve classified as Hill grade 1, otherwise completely normal.    Today, patient presents clinic and explains that she continues with a "gnawing" epigastric pain which seems to come and go "as it pleases" throughout the day.   Patient tells me initially when she wakes up she does not feel pain when she starts moving around she feels it again.  It is rated as a 9/10.  It is worse when she eats anything, about 5 minutes later or so.  She can also have 10 minutes of time throughout the day periodically where this is not hurting her at all.  Overall continues with a decreased appetite.  Also describes feeling like there is "a [place] in my stomach".  Patient has been tried on Bentyl, Hyoscyamine and Reglan recently with no change in symptoms.  Patient tells me that initially she had trouble with urination so she stopped her Hyoscyamine.  She has continued the Bentyl every 6 hours but this is not helping at all.  Also describes continuing some looser than normal stools 1-2 times a day.    Patient does ask today if an increased amount of stress could cause her symptoms.  Tells me that she is stressed in regards to her health and questions of possible Parkinson's as well as this new epigastric pain.    Denies fever, chills, vomiting or symptoms that awaken her from sleep.      Past Medical History:  Diagnosis Date  . Anxiety   . Colon cancer ([place])   . GERD (gastroesophageal reflux disease)   . Hyperlipemia   . Interstitial cystitis   . Tremor     Past Surgical History:  Procedure Laterality Date  . COLECTOMY  2005  . COLONOSCOPY  2017  . INNER EAR SURGERY     multiple ear surgeries per patient  Current Outpatient Medications  Medication Sig Dispense Refill  . carbidopa-levodopa (SINEMET IR) 25-100 MG tablet Take 1 tablet by mouth 3 (three) times daily. 270 tablet 1  . cholecalciferol (VITAMIN D) 1000 units tablet Take 1,000 Units daily by mouth. 2000 units daily    . dicyclomine (BENTYL) 20 MG tablet Take 20 mg by mouth 3 (three) times daily.    Marland Kitchen dicyclomine (BENTYL) 20 MG tablet Take 1 tablet (20 mg total) by mouth every 6 (six) hours. 90 tablet 1  . DULoxetine (CYMBALTA) 20 MG capsule Take 20 mg by mouth every 3  (three) days.     . hyoscyamine (LEVSIN SL) 0.125 MG SL tablet Place 1 tablet (0.125 mg total) under the tongue every 6 (six) hours as needed. 30 tablet 3  . metoCLOPramide (REGLAN) 10 MG tablet Take 1 tablet (10 mg total) by mouth every 6 (six) hours as needed for nausea. (Patient not taking: Reported on 05/12/2018) 90 tablet 3  . ondansetron (ZOFRAN) 4 MG tablet Take 1 tablet (4 mg total) by mouth every 6 (six) hours as needed for nausea or vomiting. 30 tablet 1  . pentosan polysulfate (ELMIRON) 100 MG capsule Take 100 mg by mouth daily.    . ranitidine (ZANTAC) 150 MG tablet Take 150 mg by mouth 2 (two) times daily.    . vitamin B-12 (CYANOCOBALAMIN) 1000 MCG tablet Take 1,000 mcg by mouth daily.     Current Facility-Administered Medications  Medication Dose Route Frequency Provider Last Rate Last Dose  . 0.9 %  sodium chloride infusion  500 mL Intravenous Once Cirigliano, Vito V, DO        Allergies as of 05/23/2018 - Review Complete 05/23/2018  Allergen Reaction Noted  . Codeine  02/23/2012  . Sulfa antibiotics  02/23/2012    Family History  Problem Relation Age of Onset  . Other Mother        ETOH  . Heart disease Father   . Hyperlipidemia Father   . Hypertension Sister   . Hypertension Brother   . Colon cancer Maternal Uncle   . Rectal cancer Maternal Uncle   . Esophageal cancer Neg Hx     Social History   Socioeconomic History  . Marital status: Married    Spouse name: Not on file  . Number of children: 2  . Years of education: Not on file  . Highest education level: Not on file  Occupational History  . Occupation: Print production planner  Social Needs  . Financial resource strain: Not on file  . Food insecurity:    Worry: Not on file    Inability: Not on file  . Transportation needs:    Medical: Not on file    Non-medical: Not on file  Tobacco Use  . Smoking status: Never Smoker  . Smokeless tobacco: Never Used  Substance and Sexual Activity  . Alcohol use: Yes     Comment: occ  . Drug use: No  . Sexual activity: Not on file  Lifestyle  . Physical activity:    Days per week: Not on file    Minutes per session: Not on file  . Stress: Not on file  Relationships  . Social connections:    Talks on phone: Not on file    Gets together: Not on file    Attends religious service: Not on file    Active member of club or organization: Not on file    Attends meetings of clubs or organizations: Not on file  Relationship status: Not on file  . Intimate partner violence:    Fear of current or ex partner: Not on file    Emotionally abused: Not on file    Physically abused: Not on file    Forced sexual activity: Not on file  Other Topics Concern  . Not on file  Social History Narrative  . Not on file    Review of Systems:    Constitutional: No weight loss, fever or chills Cardiovascular: No chest pain Respiratory: No SOB  Gastrointestinal: See HPI and otherwise negative   Physical Exam:  Vital signs: BP 112/72   Pulse (!) 112   Ht 5\' 4"  (1.626 m)   Wt 177 lb (80.3 kg)   BMI 30.38 kg/m   Constitutional:   Pleasant Caucasian female appears to be in NAD, Well developed, Well nourished, alert and cooperative Respiratory: Respirations even and unlabored. Lungs clear to auscultation bilaterally.   No wheezes, crackles, or rhonchi.  Cardiovascular: Normal S1, S2. No MRG. Regular rate and rhythm. No peripheral edema, cyanosis or pallor.  Gastrointestinal:  Soft, nondistended, nontender. No rebound or guarding. Normal bowel sounds. No appreciable masses or hepatomegaly. Rectal:  Not performed.  Psychiatric: Demonstrates good judgement and reason without abnormal affect or behaviors.  RELEVANT LABS AND IMAGING: CBC    Component Value Date/Time   WBC 6.7 05/08/2018 1020   WBC 7.6 05/02/2018 1156   RBC 4.77 05/08/2018 1020   HGB 13.4 05/08/2018 1020   HGB 12.1 05/09/2017 1343   HGB 12.9 02/19/2009 1030   HCT 42.0 05/08/2018 1020   HCT 35.6  05/09/2017 1343   HCT 38.1 02/19/2009 1030   PLT 205 05/08/2018 1020   PLT 170 05/09/2017 1343   PLT 182 02/19/2009 1030   MCV 88.1 05/08/2018 1020   MCV 88 05/09/2017 1343   MCV 87.2 02/19/2009 1030   MCH 28.1 05/08/2018 1020   MCHC 31.9 05/08/2018 1020   RDW 12.6 05/08/2018 1020   RDW 12.4 05/09/2017 1343   RDW 13.2 02/19/2009 1030   LYMPHSABS 1.1 05/08/2018 1020   LYMPHSABS 1.5 05/09/2017 1343   LYMPHSABS 1.1 02/19/2009 1030   MONOABS 0.5 05/08/2018 1020   MONOABS 0.3 02/19/2009 1030   EOSABS 0.0 05/08/2018 1020   EOSABS 0.1 05/09/2017 1343   BASOSABS 0.0 05/08/2018 1020   BASOSABS 0.0 05/09/2017 1343   BASOSABS 0.0 02/19/2009 1030    CMP     Component Value Date/Time   NA 142 05/08/2018 1020   NA 143 05/09/2017 1343   NA 140 05/13/2016 1256   K 4.0 05/08/2018 1020   K 3.7 05/09/2017 1343   K 3.7 05/13/2016 1256   CL 106 05/08/2018 1020   CL 104 05/09/2017 1343   CO2 29 05/08/2018 1020   CO2 28 05/09/2017 1343   CO2 22 05/13/2016 1256   GLUCOSE 111 05/08/2018 1020   GLUCOSE 103 05/09/2017 1343   BUN 16 05/08/2018 1020   BUN 17 05/09/2017 1343   BUN 17.2 05/13/2016 1256   CREATININE 0.80 05/08/2018 1020   CREATININE 0.9 05/09/2017 1343   CREATININE 0.7 05/13/2016 1256   CALCIUM 10.1 05/08/2018 1020   CALCIUM 9.7 05/09/2017 1343   CALCIUM 9.2 05/13/2016 1256   PROT 7.0 05/08/2018 1020   PROT 6.5 05/09/2017 1343   PROT 6.8 05/13/2016 1256   ALBUMIN 3.9 05/08/2018 1020   ALBUMIN 4.0 05/09/2017 1343   ALBUMIN 4.0 05/13/2016 1256   AST 17 05/08/2018 1020   AST 16 05/13/2016 1256  ALT 14 05/08/2018 1020   ALT 24 05/09/2017 1343   ALT 20 05/13/2016 1256   ALKPHOS 74 05/08/2018 1020   ALKPHOS 64 05/09/2017 1343   ALKPHOS 81 05/13/2016 1256   BILITOT 1.1 05/08/2018 1020   BILITOT 0.54 05/13/2016 1256   GFRNONAA >60 05/02/2018 1156   GFRAA >60 05/02/2018 1156   Assessment: 1.  Abdominal pain: Recent labs, CBC, CMP and lipase as well as a CT of the  abdomen pelvis and EGD colonoscopy unrevealing for source, patient continues with a "gnawing" now more in the epigastric region pain which is rated as a 9/10 and is mostly constant throughout today with some 10-minute breaks of relief, no help from Bentyl, Reglan, omeprazole or ranitidine; consider mesenteric ischemia vs other  Plan: 1.  Ordered CT angiogram of the abdomen and pelvis, this was scheduled for tomorrow morning 2.  Would recommend patient discontinue Bentyl as this is not helping. 3.  Provided prescription for Omeprazole 20 mg daily, 30-60 minutes before breakfast #90 with 3 refills 4.  Recommend the patient start FD guard 2 tabs twice a day.  Provided her with a coupon. 5.  Did discuss that stress can play a role in epigastric pain 6. Also added urine porphobilinogen to previously requested 24 hr urine specimen 7.  Patient to follow in clinic per recommendations after imaging above with Dr. Bryan Lemma.  Ellouise Newer, PA-C [facility] 05/23/2018, 1:29 PM  Cc: Lavena Bullion, DO

## 2018-05-23 NOTE — Progress Notes (Signed)
Agree with Ms. Mort Sawyers assessment and plan. Agree with plan for stopping Bentyl and changing to PPI and trial of FD Guard along with evaluation for vascular etiology. 24 hour urine collect previously ordered for HIAA; will add to r/o Porphyria. Will f/u results with me in clinic as already scheduled.   Sincerely appreciate MS. Lemmon seeing her on an acute basis today.   Jasmene Goswami, DO, [facility]

## 2018-05-23 NOTE — Addendum Note (Signed)
Addended by: Marzella Schlein on: 05/23/2018 03:43 PM   Modules accepted: Orders

## 2018-05-23 NOTE — Patient Instructions (Addendum)
Stop Bentyl.   Start over the counter FD Gard 2 tablets by mouth twice daily.  I have added a urine porphobilinogen test to the urine tests already being ordered by Dr. Marin Olp. Please have these tests done.  We have sent the following medications to your pharmacy for you to pick up at your convenience: omeprazole 20 mg daily.   You have been scheduled for a CT angiogram scan of the abdomen and pelvis at [facility] [place] ([address]---this is in the same building as [facility]).   You are scheduled on 05/24/18 at 9:00am. You should arrive 15 minutes prior to your appointment time for registration. Please follow the written instructions below on the day of your exam:  WARNING: IF YOU ARE ALLERGIC TO IODINE/X-RAY DYE, PLEASE NOTIFY RADIOLOGY IMMEDIATELY AT [phone]! YOU WILL BE GIVEN A 13 HOUR PREMEDICATION PREP.  1) Do not eat anything after 5:00am (4 hours prior to your test)  You may take any medications as prescribed with a small amount of water, if necessary. If you take any of the following medications: METFORMIN, GLUCOPHAGE, GLUCOVANCE, AVANDAMET, RIOMET, FORTAMET, [place] MET, JANUMET, GLUMETZA or METAGLIP, you MAY be asked to HOLD this medication 48 hours AFTER the exam.  The purpose of you drinking the oral contrast is to aid in the visualization of your intestinal tract. The contrast solution may cause some diarrhea. Depending on your individual set of symptoms, you may also receive an intravenous injection of x-ray contrast/dye. Plan on being at [facility] for 30 minutes or longer, depending on the type of exam you are having performed.   If you have any questions regarding your exam or if you need to reschedule, you may call the CT department at [phone] between the hours of 8:00 am and 5:00 pm, Monday-Friday.  ________________________________________________________________________

## 2018-05-23 NOTE — Telephone Encounter (Signed)
Called and informed patient of MD recommendations on being seen in the office today by an APP; patient is in agreement with plan of care at this time; Dr. Bryan Lemma did mention having the patient have a CTA-abd in order to have a diagnostic test in order for treatment modalities-however, the patient reported having a CT abd at [facility] when she was in the ER on 05/09/18; MD/PA to review these records in care everywhere;

## 2018-05-24 ENCOUNTER — Inpatient Hospital Stay: Admission: RE | Admit: 2018-05-24 | Payer: 59 | Source: Ambulatory Visit

## 2018-05-29 ENCOUNTER — Ambulatory Visit (HOSPITAL_BASED_OUTPATIENT_CLINIC_OR_DEPARTMENT_OTHER)
Admission: RE | Admit: 2018-05-29 | Discharge: 2018-05-29 | Disposition: A | Discharge status: Home / Self Care | Payer: 59 | Source: Ambulatory Visit | Attending: Physician Assistant | Admitting: Physician Assistant

## 2018-05-29 DIAGNOSIS — R1084 Generalized abdominal pain: Secondary | ICD-10-CM | POA: Insufficient documentation

## 2018-05-29 MED ORDER — IOPAMIDOL (ISOVUE-370) INJECTION 76%
100.0000 mL | Freq: Once | INTRAVENOUS | Status: AC | PRN
  Administered 2018-05-29: 100 mL via INTRAVENOUS

## 2018-05-29 MED FILL — SERTRALINE HCL 100 MG TAB: 100 | 90 days supply | Qty: 135 | Fill #0

## 2018-05-31 DIAGNOSIS — C187 Malignant neoplasm of sigmoid colon: Secondary | ICD-10-CM | POA: Diagnosis not present

## 2018-06-04 LAB — METANEPHRINES, URINE, 24 HOUR
Metaneph Total, Ur: 110 ug/L
Metanephrines, 24H Ur: 149 ug/24 hr (ref 45–290)
NORMETANEPHRINE 24H UR: 147 ug/(24.h) (ref 82–500)
NORMETANEPHRINE UR: 109 ug/L
Total Volume: 1350

## 2018-06-05 ENCOUNTER — Telehealth: Payer: Self-pay | Admitting: *Deleted

## 2018-06-05 ENCOUNTER — Other Ambulatory Visit: Payer: Self-pay | Admitting: *Deleted

## 2018-06-05 DIAGNOSIS — C189 Malignant neoplasm of colon, unspecified: Secondary | ICD-10-CM

## 2018-06-05 LAB — 5 HIAA, QUANTITATIVE, URINE, 24 HOUR
5 HIAA UR: 1.6 mg/L
5-HIAA,Quant.,24 Hr Urine: 2.2 mg/24 hr (ref 0.0–14.9)
Total Volume: 1350

## 2018-06-05 NOTE — Telephone Encounter (Signed)
As noted below by Dr. Marin Olp, I informed the patient that her urine test was normal. She verbalized understanding.

## 2018-06-05 NOTE — Telephone Encounter (Signed)
-----   Message from [name], MD sent at 06/05/2018 12:51 PM EST ----- Call - hte urine test is normal!!  I was testing you for a pheochromocytoma and thankfully, you do NOT have one!!  Lindsay Johnson

## 2018-06-06 ENCOUNTER — Telehealth: Payer: Self-pay | Admitting: Hematology & Oncology

## 2018-06-06 ENCOUNTER — Inpatient Hospital Stay: Payer: 59

## 2018-06-06 ENCOUNTER — Other Ambulatory Visit: Payer: Self-pay | Admitting: *Deleted

## 2018-06-06 ENCOUNTER — Inpatient Hospital Stay: Payer: 59 | Attending: Hematology & Oncology | Admitting: Hematology & Oncology

## 2018-06-06 ENCOUNTER — Other Ambulatory Visit: Payer: Self-pay

## 2018-06-06 VITALS — BP 113/72 | HR 91 | Temp 98.1°F | Resp 18 | Wt 176.2 lb

## 2018-06-06 DIAGNOSIS — Z79899 Other long term (current) drug therapy: Secondary | ICD-10-CM | POA: Diagnosis not present

## 2018-06-06 DIAGNOSIS — R5381 Other malaise: Secondary | ICD-10-CM | POA: Diagnosis not present

## 2018-06-06 DIAGNOSIS — G20 Parkinson's disease: Secondary | ICD-10-CM | POA: Diagnosis not present

## 2018-06-06 DIAGNOSIS — R5383 Other fatigue: Secondary | ICD-10-CM

## 2018-06-06 DIAGNOSIS — C189 Malignant neoplasm of colon, unspecified: Secondary | ICD-10-CM

## 2018-06-06 DIAGNOSIS — J309 Allergic rhinitis, unspecified: Secondary | ICD-10-CM

## 2018-06-06 DIAGNOSIS — Z85038 Personal history of other malignant neoplasm of large intestine: Secondary | ICD-10-CM

## 2018-06-06 DIAGNOSIS — M791 Myalgia, unspecified site: Secondary | ICD-10-CM

## 2018-06-06 DIAGNOSIS — R1084 Generalized abdominal pain: Secondary | ICD-10-CM

## 2018-06-06 LAB — CBC WITH DIFFERENTIAL (CANCER CENTER ONLY)
Abs Immature Granulocytes: 0.01 10*3/uL (ref 0.00–0.07)
Basophils Absolute: 0 10*3/uL (ref 0.0–0.1)
Basophils Relative: 0 %
Eosinophils Absolute: 0 10*3/uL (ref 0.0–0.5)
Eosinophils Relative: 0 %
HCT: 39.2 % (ref 36.0–46.0)
Hemoglobin: 12.4 g/dL (ref 12.0–15.0)
Immature Granulocytes: 0 %
Lymphocytes Relative: 19 %
Lymphs Abs: 1.1 10*3/uL (ref 0.7–4.0)
MCH: 28.6 pg (ref 26.0–34.0)
MCHC: 31.6 g/dL (ref 30.0–36.0)
MCV: 90.5 fL (ref 80.0–100.0)
MONO ABS: 0.4 10*3/uL (ref 0.1–1.0)
MONOS PCT: 6 %
Neutro Abs: 4.5 10*3/uL (ref 1.7–7.7)
Neutrophils Relative %: 75 %
Platelet Count: 161 10*3/uL (ref 150–400)
RBC: 4.33 MIL/uL (ref 3.87–5.11)
RDW: 12.9 % (ref 11.5–15.5)
WBC Count: 6 10*3/uL (ref 4.0–10.5)
nRBC: 0 % (ref 0.0–0.2)

## 2018-06-06 LAB — CMP (CANCER CENTER ONLY)
ALT: 4 U/L (ref 0–44)
AST: 17 U/L (ref 15–41)
Albumin: 4.4 g/dL (ref 3.5–5.0)
Alkaline Phosphatase: 65 U/L (ref 38–126)
Anion gap: 8 (ref 5–15)
BUN: 14 mg/dL (ref 6–20)
CO2: 28 mmol/L (ref 22–32)
Calcium: 9.4 mg/dL (ref 8.9–10.3)
Chloride: 106 mmol/L (ref 98–111)
Creatinine: 0.61 mg/dL (ref 0.44–1.00)
GFR, Est AFR Am: >60 mL/min (ref >60)
GFR, Estimated: >60 mL/min (ref >60)
GLUCOSE: 131 mg/dL — AB (ref 70–99)
Potassium: 3.6 mmol/L (ref 3.5–5.1)
Sodium: 142 mmol/L (ref 135–145)
Total Bilirubin: 0.4 mg/dL (ref 0.3–1.2)
Total Protein: 6.5 g/dL (ref 6.5–8.1)

## 2018-06-06 NOTE — Telephone Encounter (Signed)
Appointments scheduled LMVM for patient and mailed letter/calendar as well per 12/3 los

## 2018-06-06 NOTE — Progress Notes (Signed)
Hematology and Oncology Follow Up Visit  Lindsay Johnson [id number] February 04, 1961 57 y.o. 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  She is still a "mystery" as to what is wrong with her.  We have not yet uncovered why she is having all of these problems.  We did do a CT angiogram of her abdomen pelvis.  This did not show any obvious mesenteric ischemia.  She had an upper endoscopy done.  This was done on 05/23/2018.  Everything looked fine.  Biopsies were taken.  There is no evidence of celiac disease.  There is no evidence of Helicobacter although she had some gastritis.  She is had multiple lab studies done.  Everything on her labs look okay.  She is had 24-hour urine done for catecholamines.  This was also okay.  I have noted that in the past, she has had a positive ANA.  I think that she has seen rheumatology.  She is apparently on Sinemet for Parkinson's.  She is seeing neurology.  Thankfully, there is been no evidence of any problems with respect to cancer recurrence.  She wants to try to feel better so that she will have a good time when she goes on a family vacation down to [place] in January.  Overall, I would say that that her performance status is ECOG 1-2.      Medications:  Current Outpatient Medications:  .  carbidopa-levodopa (SINEMET IR) 25-100 MG tablet, Take 1 tablet by mouth 3 (three) times daily., Disp: 270 tablet, Rfl: 1 .  cholecalciferol (VITAMIN D) 1000 units tablet, Take 1,000 Units daily by mouth. 2000 units daily, Disp: , Rfl:  .  Omega-3 1000 MG CAPS, Take by mouth., Disp: , Rfl:  .  omeprazole (PRILOSEC) 20 MG capsule, Take 1 capsule (20 mg total) by mouth daily. (Patient taking differently: Take 40 mg by mouth daily. ), Disp: 90 capsule, Rfl: 3 .  pentosan polysulfate (ELMIRON) 100 MG capsule, Take 100 mg by  mouth daily., Disp: , Rfl:  .  Probiotic Product (PROBIOTIC-10 PO), Take by mouth daily., Disp: , Rfl:  .  sertraline (ZOLOFT) 100 MG tablet, Take 150 mg by mouth daily., Disp: , Rfl:  .  vitamin B-12 (CYANOCOBALAMIN) 1000 MCG tablet, Take 1,000 mcg by mouth daily., Disp: , Rfl:  .  neomycin-polymyxin-hydrocortisone (CORTISPORIN) 3.5-10000-1 OTIC suspension, INSTILL 3 DROPS INTO LEFT EAR 4 TIMES DAILY FOR 10 DAYS, Disp: , Rfl: 0  Allergies:  Allergies  Allergen Reactions  . Codeine     Makes Pt very "hyper"  . Sulfa Antibiotics     rash    Past Medical History, Surgical history, Social history, and Family History were reviewed and updated.  Review of System:   Review of Systems  Constitutional: Positive for malaise/fatigue.  HENT: Negative.   Eyes: Negative.   Respiratory: Negative.  Negative for cough.   Cardiovascular: Negative.   Gastrointestinal: Positive for heartburn and nausea.  Genitourinary: Negative.   Musculoskeletal: Positive for joint pain and myalgias.  Skin: Negative.   Neurological: Positive for tremors.  Endo/Heme/Allergies: Negative.   Psychiatric/Behavioral: Negative.      Physical Exam:  weight is 176 lb 4 oz (79.9 kg). Her oral temperature is 98.1 F (36.7 C). Her blood pressure is 113/72 and her pulse is 91. Her respiration is 18 and oxygen  saturation is 97%.   Wt Readings from Last 3 Encounters:  06/06/18 176 lb 4 oz (79.9 kg)  05/23/18 177 lb (80.3 kg)  05/17/18 178 lb (80.7 kg)     Physical Exam  Constitutional: She is oriented to person, place, and time.  HENT:  Head: Normocephalic and atraumatic.  Mouth/Throat: Oropharynx is clear and moist.  Eyes: Pupils are equal, round, and reactive to light. EOM are normal.  Neck: Normal range of motion.  Cardiovascular: Normal rate, regular rhythm and normal heart sounds.  Pulmonary/Chest: Effort normal and breath sounds normal.  Abdominal: Soft. Bowel sounds are normal.  Musculoskeletal: Normal  range of motion. She exhibits no edema, tenderness or deformity.  Lymphadenopathy:    She has no cervical adenopathy.  Neurological: She is alert and oriented to person, place, and time.  Skin: Skin is warm and dry. No rash noted. No erythema.  Psychiatric: She has a normal mood and affect. Her behavior is normal. Judgment and thought content normal.  Vitals reviewed.    Lab Results  Component Value Date   WBC 6.0 06/06/2018   HGB 12.4 06/06/2018   HCT 39.2 06/06/2018   MCV 90.5 06/06/2018   PLT 161 06/06/2018     Chemistry      Component Value Date/Time   NA 142 06/06/2018 1118   NA 143 05/09/2017 1343   NA 140 05/13/2016 1256   K 3.6 06/06/2018 1118   K 3.7 05/09/2017 1343   K 3.7 05/13/2016 1256   CL 106 06/06/2018 1118   CL 104 05/09/2017 1343   CO2 28 06/06/2018 1118   CO2 28 05/09/2017 1343   CO2 22 05/13/2016 1256   BUN 14 06/06/2018 1118   BUN 17 05/09/2017 1343   BUN 17.2 05/13/2016 1256   CREATININE 0.61 06/06/2018 1118   CREATININE 0.9 05/09/2017 1343   CREATININE 0.7 05/13/2016 1256      Component Value Date/Time   CALCIUM 9.4 06/06/2018 1118   CALCIUM 9.7 05/09/2017 1343   CALCIUM 9.2 05/13/2016 1256   ALKPHOS 65 06/06/2018 1118   ALKPHOS 64 05/09/2017 1343   ALKPHOS 81 05/13/2016 1256   AST 17 06/06/2018 1118   AST 16 05/13/2016 1256   ALT 4 06/06/2018 1118   ALT 24 05/09/2017 1343   ALT 20 05/13/2016 1256   BILITOT 0.4 06/06/2018 1118   BILITOT 0.54 05/13/2016 1256         Impression and Plan: Lindsay Johnson is a 57 year old white female. She had stage II invasive colon cancer 14 years ago. She underwent resection. She underwent adjuvant chemotherapy with FOLFOX.  I still have to believe that what is going on with her must be some type of autoimmune issue.  I do not know she has some kind of autoimmune problem given that she had this positive ANA.  From my point of view, I just do not see any other test that we can run.  We will do a 24-hour  urine on her for porphyrins.  I would think that having porphyria would be highly unlikely but yet we can test forward.  I will plan to see her back in about 4 months.  We will see how she is doing.  If, her 24-hour urine is positive for porphyrins, we will get her back sooner.   [name], MD 12/3/201912:11 PM

## 2018-06-08 ENCOUNTER — Encounter: Payer: Self-pay | Admitting: Hematology & Oncology

## 2018-06-08 ENCOUNTER — Other Ambulatory Visit: Payer: 59

## 2018-06-08 DIAGNOSIS — Z85038 Personal history of other malignant neoplasm of large intestine: Secondary | ICD-10-CM | POA: Diagnosis not present

## 2018-06-09 LAB — PORPHOBILINOGEN, 24 HR URINE-QUANT
Porphobilinogen Qual, Ur: 0.7 mg/L (ref 0.0–2.0)
Porphobilinogen, 24H Ur: 0.7 mg/24 hr (ref 0.0–1.5)
Total Volume: 1050

## 2018-06-09 MED FILL — lamoTRIgine 25 MG TABS: 25 | 31 days supply | Qty: 54 | Fill #0

## 2018-06-09 MED FILL — lamoTRIgine 100 MG TABS: 100 | 30 days supply | Qty: 30 | Fill #0

## 2018-06-12 ENCOUNTER — Ambulatory Visit: Payer: 59 | Admitting: Gastroenterology

## 2018-06-12 ENCOUNTER — Encounter: Payer: Self-pay | Admitting: Gastroenterology

## 2018-06-12 VITALS — BP 116/68 | HR 64 | Ht 64.0 in | Wt 176.0 lb

## 2018-06-12 DIAGNOSIS — Z85038 Personal history of other malignant neoplasm of large intestine: Secondary | ICD-10-CM

## 2018-06-12 DIAGNOSIS — K219 Gastro-esophageal reflux disease without esophagitis: Secondary | ICD-10-CM | POA: Diagnosis not present

## 2018-06-12 DIAGNOSIS — R110 Nausea: Secondary | ICD-10-CM

## 2018-06-12 LAB — PORPHYRINS, FRACTIONATED URINE (TIMED COLLECTION)
Copropor(CP)III,24hr: 81 ug/24 hr — ABNORMAL HIGH (ref 0–74)
Coproporph(CP)I,24hr: 15 ug/24 hr (ref 0–24)
Coproporphyrin I: 14 ug/L
Coproporphyrin III: 77 ug/L
HEXACARBOXYPORPHYRIN: 1 ug/(24.h) (ref 0–1)
Heptacarboxyl (7-CP): 2 ug/L
Heptacarboxyl Porphyrin, 24H Ur: 2 ug/24 hr (ref 0–4)
Hexacarboxyl (6-CP): 1 ug/L
Pentacarboxyl (5-CP): 1 ug/L
Pentacarboxyporphyrin: 1 ug/24 hr (ref 0–4)
Total Volume: 1050
Uroporphyrin, Urine: 7 ug/24 hr (ref 0–24)
Uroporphyrins (UP): 7 ug/L

## 2018-06-12 LAB — CATECHOLAMINES,UR.,FREE,24 HR: Total Volume: 1050

## 2018-06-12 MED ORDER — OMEPRAZOLE 40 MG PO CPDR: 40.0000 mg | DELAYED_RELEASE_CAPSULE | ORAL | 0 refills | Status: DC

## 2018-06-12 MED ORDER — FLUCONAZOLE 150 MG PO TABS: 150.0000 mg | ORAL_TABLET | Freq: Once | ORAL | 0 refills | Status: AC

## 2018-06-12 MED ORDER — OMEPRAZOLE 40 MG PO CPDR: 40.0000 mg | DELAYED_RELEASE_CAPSULE | Freq: Every day | ORAL | 3 refills | Status: DC

## 2018-06-12 MED FILL — OMEPRAZOLE 40 MG CPDR: 40 | 30 days supply | Qty: 60 | Fill #0

## 2018-06-12 MED FILL — FLUCONAZOLE 150 MG TABS: 150 | 2 days supply | Qty: 2 | Fill #0

## 2018-06-12 NOTE — Progress Notes (Signed)
P  Chief Complaint:    Abdominal pain-resolved; nausea  GI History: -05/12/2018 seen in clinic by Dr. Bryan Lemma.  At that time, it was discussed she had a history of colon cancer diagnosed in 2004 s/p resection and chemotherapy in 2005, movement disorder/parkinsonism and is being seen for abdominal pain.  Describes pain started October 21 after flying back from [place] with abrupt onset of facial flushing, abdominal pain, nausea, fatigue, lightheadedness and tremors.  Abdominal pain is in the lower abdomen and now rating to the entire abdomen, worse after eating.  Also with a change to constipation with straining.  Abdominal x-ray 05/05/2018 showed constipation.  Also seen at [facility] ER 05/09/2018 with normal CBC, CMP, UA and CT.  Also reported nausea with no vomiting.  Reglan was not helping.  At that time, it was discussed that it could be secondary to constipation, IBS or mucosal/luminal etiologies.  She was scheduled for an EGD and colonoscopy as well as H. pylori serology and her underlying constipation was treated with MiraLAX. Recommended she resume Bentyl, as discussed with this is unrevealing/unsuccessful could consider abdominal Doppler ultrasound or CT angiogram. -05/17/2018 colonoscopy with one 8 mm polyp in the cecum, one 8 mm polyp at the splenic flexure, two 3-4 mm polyps in the sigmoid and descending colon and a patent end-to-end colocolonic anastomosis characterized by healthy-appearing mucosa, repeat colonoscopy was recommended in 1 year.  Pathology showed tubular adenomas.  EGD revealed normal esophagus, gastroesophageal flap valve classified as Hill grade 1, otherwise completely normal. -05/23/2018 seen in clinic by Ellouise Newer for "gnawing" epigastric pain which occurs intermittently throughout the day.  No relief with Bentyl, hyoscyamine, and Reglan.  Discussed relationship of her symptoms to recent increase in stress, particularly related to recent Parkinson's.  Evaluated  with CT angiogram which was unrevealing, discontinued Bentyl given no appreciable improvement and increased to omeprazole 40 mg daily, FD guard twice daily.  Also ordered for urine porphobilinogen was negative.  Was also tested for pheochromocytoma by Oncologist, which was negative.  HPI:     Patient is a 57 y.o. female presenting to the [facility]ology [facility] for follow-up.  He was initially seen by me on 05/12/2018 for abdominal pain which has now resolved.  Today she states she is having ongoing postprandial nausea without emesis. Nausea can occur independent of food types or which meal. Occurs immediately after eating. Nausea lasts approx 30-45 mins. She continues to take FD Guard 2 tabs BID.  Of note, Sinemet started in July and nausea started in September/October.    Review of systems:     No chest pain, no SOB, no fevers, no urinary sx   Past Medical History:  Diagnosis Date  . Anxiety   . Colon cancer ([place])   . GERD (gastroesophageal reflux disease)   . Hyperlipemia   . Interstitial cystitis   . Tremor     Patient's surgical history, family medical history, social history, medications and allergies were all reviewed in Epic    Current Outpatient Medications  Medication Sig Dispense Refill  . carbidopa-levodopa (SINEMET IR) 25-100 MG tablet Take 1 tablet by mouth 3 (three) times daily. 270 tablet 1  . cholecalciferol (VITAMIN D) 1000 units tablet Take 1,000 Units daily by mouth. 2000 units daily    . lamoTRIgine (LAMICTAL) 200 MG tablet Take 200 mg by mouth daily.    Marland Kitchen neomycin-polymyxin-hydrocortisone (CORTISPORIN) 3.5-10000-1 OTIC suspension INSTILL 3 DROPS INTO LEFT EAR 4 TIMES DAILY FOR 10 DAYS  0  .  Omega-3 1000 MG CAPS Take by mouth.    Marland Kitchen omeprazole (PRILOSEC) 20 MG capsule Take 1 capsule (20 mg total) by mouth daily. (Patient taking differently: Take 40 mg by mouth daily. ) 90 capsule 3  . pentosan polysulfate (ELMIRON) 100 MG capsule Take 100 mg by mouth daily.    .  Probiotic Product (PROBIOTIC-10 PO) Take by mouth daily.    . sertraline (ZOLOFT) 100 MG tablet Take 150 mg by mouth daily.    . vitamin B-12 (CYANOCOBALAMIN) 1000 MCG tablet Take 1,000 mcg by mouth daily.     No current facility-administered medications for this visit.     Physical Exam:     BP 116/68   Pulse 64   Ht 5\' 4"  (1.626 m)   Wt 176 lb (79.8 kg)   BMI 30.21 kg/m   GENERAL:  Pleasant female in NAD PSYCH: : Cooperative, normal affect EENT:  conjunctiva pink, mucous membranes moist, neck supple without masses CARDIAC:  RRR, no murmur heard, no peripheral edema PULM: Normal respiratory effort, lungs CTA bilaterally, no wheezing ABDOMEN:  Nondistended, soft, nontender. No obvious masses, no hepatomegaly,  normal bowel sounds SKIN:  turgor, no lesions seen Musculoskeletal:  Normal muscle tone, normal strength NEURO: Alert and oriented x 3, no focal neurologic deficits   IMPRESSION and PLAN:    #1. Nausea without emesis: She has been having ongoing daily nausea without emesis for the last 3 months or so.  EGD demonstrated non-H. pylori, nonulcer gastritis.  While her abdominal pain has improved the nausea still ensues.  I have a high suspicion that this is secondary to the Sinemet which was started in July (5-20% ADR of nausea).  However, certainly do not want to discontinue this medication at this juncture and instead will treat as below.  - Increase Prilosec to 40 mg PO BID x4 weeks then reduce back to daily to eval for clinical improvement in nausea - Resume FD Guard for now - If not resolved, trial anti-emetic until able to f/u with Neuro in January    #2.  Abdominal pain: Resolved with omeprazole and FD guard.  Will resume at this time and can titrate to lowest effective dose in the future.  #3.  History of colon cancer: Prior history of colon cancer in 2004 treated in 2005 with recent colonoscopy notable for 2 subcentimeter sessile serrated polyps and 3 small  hyperplastic polyps with recommendation repeat in 1 year. -Repeat colonoscopy in 2020 for ongoing surveillance  #4.  Yeast infection: -Provided Rx for Diflucan 150 mg take 1 tab  #2 RF0 for yeast infection as requested  I spent a total of 15 minutes of face-to-face time with the patient. Greater than 50% of the time was spent counseling and coordinating care.    [place] ,DO, FACG 06/12/2018, 2:22 PM

## 2018-06-12 NOTE — Patient Instructions (Addendum)
If you are age 55 or older, your body mass index should be between 23-30. Your Body mass index is 30.21 kg/m. If this is out of the aforementioned range listed, please consider follow up with your Primary Care Provider.  If you are age 39 or younger, your body mass index should be between 19-25. Your Body mass index is 30.21 kg/m. If this is out of the aformentioned range listed, please consider follow up with your Primary Care Provider.   We have sent the following medications to your pharmacy for you to pick up at your convenience: Omperazole 40 mg by mouth twice daily for 4 weeks then decrease to once daily. Diflucan 150mg  by mouth once.  Please call our office at [phone] to set up your 3 month follow up visit.  It was a pleasure to see you today!  Vito Cirigliano, D.O.

## 2018-06-13 ENCOUNTER — Encounter: Payer: Self-pay | Admitting: *Deleted

## 2018-06-15 ENCOUNTER — Ambulatory Visit: Payer: Self-pay | Admitting: Otolaryngology

## 2018-06-16 ENCOUNTER — Telehealth: Payer: Self-pay | Admitting: Neurology

## 2018-06-16 NOTE — Telephone Encounter (Signed)
Called and spoke with Pt. She is questioning if Sinemet can increase depression, cause loss of appetite & nausea. She has also noticed her hands and feet shaking. She states not exactly tremors, actual shaking.   She is seeing a therapist and has been started on a medication for depression, cannot recall the name, though does not believe it has taken effect yet. She states her husband is concerned about her increased depression as well.

## 2018-06-16 NOTE — Telephone Encounter (Signed)
Spoke to [name] this afternoon and informed him that per Dr. Carles Collet that the Sinemet should not be causing her depression. Dr. Carles Collet recommends that patient follow-up with psychiatry. Lindsay Johnson verbalized understanding. Wife also called today and spoke with [facility] regarding the same issue. Lindsay Johnson will call wife back and follow up with her as well to reiterate that the Sinemet is not causing the depression to worsen.

## 2018-06-16 NOTE — Telephone Encounter (Signed)
Patient called and would like to speak with someone regarding her Sinemet medication. She feels some symptoms that she is having is related to that medication. Please Call. Thanks

## 2018-06-16 NOTE — Telephone Encounter (Signed)
She additional telephone notes in chart for follow-up. Patient and husband both called and left messages regarding the same issues.

## 2018-06-16 NOTE — Telephone Encounter (Signed)
It doesn't cause depression.  carbidopa/levodopa 25/100 IS dopamine, which, if anything allows [place] to feel good.  I don't know the difference between "tremor and shaking" based on description, but I just wonder if this isn't anxiety/depression.  Make sure that psychiatry is involved with her care.

## 2018-06-16 NOTE — Telephone Encounter (Signed)
Lindsay Johnson patients husband called concerned about his wife's depression. He states her depression is getting worse and that she is crying everyday. She is seeing a therapist for the depression however she seems to be getting worse. He wanted to know if the Sinemet IR could be causing some of these symptoms in his wife. Please call patient and advise. He can be reached at [phone].

## 2018-06-20 ENCOUNTER — Ambulatory Visit: Payer: 59 | Admitting: Gastroenterology

## 2018-06-22 ENCOUNTER — Ambulatory Visit (HOSPITAL_COMMUNITY): Payer: 59

## 2018-06-22 ENCOUNTER — Other Ambulatory Visit (HOSPITAL_COMMUNITY): Payer: 59

## 2018-07-09 ENCOUNTER — Encounter: Payer: Self-pay | Admitting: Hematology & Oncology

## 2018-07-11 MED FILL — OMEPRAZOLE 40 MG CPDR: 40 | 30 days supply | Qty: 30 | Fill #0

## 2018-07-12 MED FILL — lamoTRIgine 100 MG TABS: 100 | 30 days supply | Qty: 30 | Fill #1

## 2018-07-13 MED FILL — ELMIRON 100 MG CAPSULE: 100 | 30 days supply | Qty: 90 | Fill #0

## 2018-07-25 MED FILL — CARBIDOPA/LEVO 25/100 TAB: 25-100 | 30 days supply | Qty: 90 | Fill #0

## 2018-08-03 ENCOUNTER — Ambulatory Visit (HOSPITAL_COMMUNITY)
Admission: RE | Admit: 2018-08-03 | Discharge: 2018-08-03 | Disposition: A | Discharge status: Home / Self Care | Payer: 59 | Source: Ambulatory Visit | Attending: Neurology | Admitting: Neurology

## 2018-08-03 DIAGNOSIS — R251 Tremor, unspecified: Secondary | ICD-10-CM

## 2018-08-03 MED ORDER — IODINE STRONG (LUGOLS) 5 % PO SOLN
0.8000 mL | Freq: Once | ORAL | Status: DC
  Filled 2018-08-03: qty 0.8

## 2018-08-03 MED ORDER — IOFLUPANE I 123 185 MBQ/2.5ML IV SOLN
4.8000 | Freq: Once | INTRAVENOUS | Status: AC
  Administered 2018-08-03: 4.8 via INTRAVENOUS
  Filled 2018-08-03: qty 5

## 2018-08-04 ENCOUNTER — Telehealth: Payer: Self-pay | Admitting: *Deleted

## 2018-08-04 NOTE — Telephone Encounter (Signed)
-----   Message from [place], DO sent at 08/04/2018  8:47 AM EST ----- Let pt know that, as suspected, DaT scan showed loss of dopamine.  She is being treated appropriately with medication and we can talk more at visit

## 2018-08-04 NOTE — Telephone Encounter (Signed)
We will discuss at her visit.  I cannot change her meds over the phone given I just started them and haven't even seen her on meds yet.

## 2018-08-04 NOTE — Telephone Encounter (Signed)
Called patient and gave her the results and instructions.  She said that her medication is not working as well as it was at first.  Please advise.

## 2018-08-07 ENCOUNTER — Encounter: Payer: Self-pay | Admitting: Gastroenterology

## 2018-08-07 ENCOUNTER — Encounter: Payer: Self-pay | Admitting: *Deleted

## 2018-08-07 ENCOUNTER — Ambulatory Visit: Payer: 59 | Admitting: Gastroenterology

## 2018-08-07 VITALS — BP 108/72 | HR 95 | Ht 64.0 in | Wt 174.4 lb

## 2018-08-07 DIAGNOSIS — K5900 Constipation, unspecified: Secondary | ICD-10-CM

## 2018-08-07 DIAGNOSIS — R110 Nausea: Secondary | ICD-10-CM | POA: Diagnosis not present

## 2018-08-07 DIAGNOSIS — K219 Gastro-esophageal reflux disease without esophagitis: Secondary | ICD-10-CM | POA: Diagnosis not present

## 2018-08-07 MED ORDER — DOCUSATE SODIUM 100 MG PO CAPS: 100.0000 mg | ORAL_CAPSULE | Freq: Two times a day (BID) | ORAL | 11 refills | Status: DC | PRN

## 2018-08-07 MED FILL — lamoTRIgine 100 MG TABS: 100 | 30 days supply | Qty: 30 | Fill #0

## 2018-08-07 MED FILL — DOK 100 MG SOFTGEL: 100 | 50 days supply | Qty: 100 | Fill #0

## 2018-08-07 NOTE — Patient Instructions (Addendum)
If you are age 56 or older, your body mass index should be between 23-30. Your Body mass index is 29.93 kg/m. If this is out of the aforementioned range listed, please consider follow up with your Primary Care Provider.  If you are age 28 or younger, your body mass index should be between 19-25. Your Body mass index is 29.93 kg/m. If this is out of the aformentioned range listed, please consider follow up with your Primary Care Provider.   We have sent the following medications to your pharmacy for you to pick up at your convenience: Colace 100mg  twice daily as needed.  Stop taking your Prilosec and begin taking Pepcid.  Please purchase the following medications over the counter and take as directed: Peppermint Altoids  It was a pleasure to see you today!  Vito Cirigliano, D.O.

## 2018-08-07 NOTE — Progress Notes (Signed)
P  Chief Complaint:    Nausea, constipation  GI History: -05/12/2018 seen in clinic by Dr. Bryan Lemma. At that time, itwas discussed she had a history of colon cancer diagnosed in 2004 s/presection and chemotherapy in 2005, movement disorder/parkinsonism and is being seen for abdominal pain. Describes pain started October 21 after flying back from [place] with abrupt onset of facial flushing, abdominal pain, nausea, fatigue, lightheadedness and tremors. Abdominal pain is in the lower abdomen and now rating to the entire abdomen, worse after eating. Also with a change to constipation with straining. Abdominal x-ray 05/05/2018 showed constipation. Also seen at [facility] ER 05/09/2018 with normal CBC, CMP, UA and CT. Also reported nausea with no vomiting. Reglan was not helping. At that time, itwas discussed that it could be secondary to constipation, IBS or mucosal/luminal etiologies. She was scheduled for an EGD and colonoscopy as well as H. pylori serology and her underlying constipation was treated with MiraLAX. Recommended she resume Bentyl, as discussed with this is unrevealing/unsuccessful could consider abdominal Doppler ultrasound or CT angiogram. -05/17/2018 colonoscopy with one8 mm TA in the cecum, one8 mm TA at the splenic flexure, two3-4 mm TAs in the sigmoid and descending colon and a patent end-to-end colocolonic anastomosis characterized by healthy-appearing mucosa, repeat colonoscopy was recommended in 1 year. EGD revealed normal esophagus, gastroesophageal flap valve classified as Hill grade 1, otherwise completely normal. -05/23/2018 seen in clinic by Ellouise Newer for "gnawing" epigastric pain which occurs intermittently throughout the day.  No relief with Bentyl, hyoscyamine, and Reglan.  Discussed relationship of her symptoms to recent increase in stress, particularly related to recent Parkinson's.  Evaluated with CT angiogram which was unrevealing, discontinued  Bentyl given no appreciable improvement and increased to omeprazole 40 mg daily, FD guard twice daily.  Also ordered for urine porphobilinogen was negative.  Was also tested for pheochromocytoma by Oncologist, which was negative. -06/12/2018 seen by Dr. Bryan Lemma.  Abdominal pain resolved.  Ongoing postprandial nausea without emesis.  Suspected 2/2 Sinemet which was started in July (5-20% ADR of nausea), treated with short course high-dose PPI, resumed FD guard the recommended follow-up.  HPI:     Patient is a 58 y.o. female presenting to the Gastroenterology Clinic for follow-up.  She was last seen by me on 06/12/2017 for ongoing nausea suspected 2/2 medication ADR.  Today she states nausea has improved but still present and still no emesis. She has since started Lamictal (ADR nausea 7-14%).  Does not need to take any antiemetics.  Weight is stable.  She does endorse some recent constipation, and has started herself on some OTC Colace.  Takes a couple times per week to keep stools soft, without straining.  Otherwise no hematochezia or melena.  Does wonder if this to his medication ADR.  Review of systems:     No chest pain, no SOB, no fevers, no urinary sx   Past Medical History:  Diagnosis Date  . Anxiety   . Colon cancer ([place])   . GERD (gastroesophageal reflux disease)   . Hyperlipemia   . Interstitial cystitis   . Tremor     Patient's surgical history, family medical history, social history, medications and allergies were all reviewed in Epic    Current Outpatient Medications  Medication Sig Dispense Refill  . carbidopa-levodopa (SINEMET IR) 25-100 MG tablet Take 1 tablet by mouth 3 (three) times daily. 270 tablet 1  . cholecalciferol (VITAMIN D) 1000 units tablet Take 1,000 Units daily by mouth. 2000 units  daily    . lamoTRIgine (LAMICTAL) 200 MG tablet Take 200 mg by mouth daily.    Marland Kitchen neomycin-polymyxin-hydrocortisone (CORTISPORIN) 3.5-10000-1 OTIC suspension INSTILL 3 DROPS INTO  LEFT EAR 4 TIMES DAILY FOR 10 DAYS  0  . Omega-3 1000 MG CAPS Take by mouth.    Marland Kitchen omeprazole (PRILOSEC) 40 MG capsule Take 1 capsule (40 mg total) by mouth as directed. Take one capsule by mouth twice daily for 4 weeks, then decrease to one capsule by mouth once daily. 60 capsule 0  . omeprazole (PRILOSEC) 40 MG capsule Take 1 capsule (40 mg total) by mouth daily. 90 capsule 3  . pentosan polysulfate (ELMIRON) 100 MG capsule Take 100 mg by mouth daily.    . Probiotic Product (PROBIOTIC-10 PO) Take by mouth daily.    . sertraline (ZOLOFT) 100 MG tablet Take 150 mg by mouth daily.    . vitamin B-12 (CYANOCOBALAMIN) 1000 MCG tablet Take 1,000 mcg by mouth daily.     No current facility-administered medications for this visit.    Facility-Administered Medications Ordered in Other Visits  Medication Dose Route Frequency Provider Last Rate Last Dose  . Iodine Strong (Lugols) 5 % solution 0.8 mL  0.8 mL Oral Once Gus Height, MD        Physical Exam:     Ht 5\' 4"  (1.626 m)   Wt 174 lb 6 oz (79.1 kg)   BMI 29.93 kg/m   GENERAL:  Pleasant female in NAD PSYCH: : Cooperative, normal affect EENT:  conjunctiva pink, mucous membranes moist, neck supple without masses ABDOMEN:  Nondistended, soft, nontender. No obvious masses, no hepatomegaly,  normal bowel sounds SKIN:  turgor, no lesions seen Musculoskeletal:  Normal muscle tone, normal strength NEURO: Alert and oriented x 3, no focal neurologic deficits   IMPRESSION and PLAN:    #1. Nausea without emesis: Near daily nausea without emesis, overall stable and less bothersome.  Tolerating p.o. intake and weight is stable.  EGD demonstrated non-H. pylori, nonulcer gastritis.  Symptoms seem temporally related to starting her Sinemet (5-20% ADR of nausea), but given the critical need for this medication, plan to treat symptomatically rather than med adjustment.  Has follow-up appointment with Neurology next week.  Was additionally started on  Lamictal which also carries 7 to 14% ADR of nausea.    -FD guard improved symptoms, but given the high cost, will trial course of peppermint Altoids - If not improving, or more bothersome, can always trial anti-emetic    #2.  GERD without esophagitis: Reflux symptoms well controlled on daily PPI.  She would like to change from PPI to H2 RA given side effect profile.  - Change from Prilosec to Pepcid.  She already has a filled prescription at home.  #3.  Abdominal pain: Resolved with omeprazole and FD guard.  Will resume at this time and can titrate to lowest effective dose in the future.  #4.  Constipation: Constipation as a listed ADR of Sinemet at approximately 6%, but there is also autonomic dysfunction with Parkinson's Disease, commonly manifesting as constipation, so either/both could be at play.  -Trial Colace 100 mg as needed up to BID -Resume high fluid intake -Resume high-fiber diet  #5.  History of colon cancer: Prior history of colon cancer in 2004 treated in 2005 with recent colonoscopy notable for 2 subcentimeter sessile serrated polyps and 3 small hyperplastic polyps with recommendation repeat in 1 year. -Repeat colonoscopy in 2020 for ongoing surveillance.  Will review on follow-up appointment  #  6.  Chronic PPI use: I have reviewed the indications, risks, and benefits of PPI therapy with the patient today. I have discussed studies that raise ? of increased osteoporosis, dementia, and kidney failure and explained that these studies show very weak associations of unclear significance and not clear cause and effect. We did discuss the potential for vitamin malabsorption, to include magnesium (very rare), calcium (easily modifiable with Calcium Citrate supplement), vitamin B12 (again, correctable with oral B12 supplement), and iron (although rarely clinically significant outside patients who require iron supplementation previously), and can monitor each of these periodically  with routine labs. We have agreed to continue PPI treatment in this case.   RTC in 3 to 6 months or sooner as needed   Lavena Bullion ,DO, FACG 08/07/2018, 9:20 AM

## 2018-08-07 NOTE — Telephone Encounter (Signed)
See note in chart

## 2018-08-08 NOTE — Progress Notes (Signed)
Subjective:   Lindsay Johnson was seen in consultation in the movement disorder clinic at the request of Dr. Martha Clan.  Her PCP is Lindsay Graven D, NP.  The evaluation is for tremor and balance change.   The records that were made available to me were reviewed.  She has seen 2 other neurologists for the same.  Pt saw Dr. Linus Salmons in 08/2016.  He thought she possibly had a essential tremor, but given her complaints to him of dizziness and cognitive change, he did an MRI of the brain.  This was completed in March, 2018.  I only have the reports, but not the films.  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  He started her on propranolol, 80 mg at follow-up and told her to return in 6 months. She states that she was only started on 20 mg and it helped but then it quit working so she increased to 20 mg bid.  It is not working as well now.  She notes it is lowering her BP but she is asymptommatic with that.   She saw a different neurologist in 05/2017, Dr. Nestor Ramp.  Sleep study was recommended for her complaints of fatigue and cognitive change.  It appears anxiety was also felt to be playing a role.  Repeat MRI was recommended in 6 months to 1 year.  She states that sx's are getting worse.  She cannot walk a straight line.  She feels like she has inner tremor, as well as external tremor.  She has trouble with coordination issues like washing her hair, putting on and off her coat, getting in and out of car and "I feel like I have no muscle strength."  Some paresthesias in all of the fingers on all of the hands.  She admits to neck pain since November.  She has trouble checking her blind spot when driving.  She states that her urologist thinks that neck issues have to do with urinary hesitancy, frequency and urgency.     Tremor started approximately 1 year ago and involves the bilateral UE.  Tremor is most noticeable when using the hands.   There is no family hx of tremor.     Affected by caffeine:  unknown (only drinks decaf coffee) Affected by alcohol:  (2-3 glasses wine/day on weekend and makes tremor and balance worse) Affected by stress:  Yes.   Affected by fatigue:  No. Spills soup if on spoon:  No., but she can feel the tremor Spills glass of liquid if full:  No. Affects ADL's (tying shoes, brushing teeth, etc):  No., but does have some trouble with flossing teeth and with handwriting  Current/Previously tried tremor medications: propranolol  Current medications that may exacerbate tremor:  n/a  Outside reports reviewed: historical medical records, lab reports, office notes and referral letter/letters.  Living situation:  Pt lives with their spouse.  The patient does not do the finances in the home. Husband takes care of finances.   The patient does drive but sometimes she feels that she has a "brain fog" when she drives.  Fatigue got better when she Johnson/c ambien but the brain fog didn't.  The patient does cook.  There is no difficulty remembering common recipes.  The stovetop has rarely been left on accidentally.  ADL's:  The patient is  able to perform her own ADL's. The patient self medicates.The patients bladder and bowel are under good control.   Behavior:  There have been no behavioral changes over the years.  On zoloft and doing well  05/05/18 update: Patient is seen today in follow-up for complaints of gait change and tremor.  She had multiple studies completed after our last visit.  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  MRI of the cervical spine demonstrate a mild degenerative changes.  She had an EMG that was normal.    She subsequently sought an opinion at the [place], where she moved for a period of time (she lives there in the summer).  I have reviewed those records.  She saw a Dr. Pati Johnson, who is an internist. In July, 2019, her internist placed her on  levodopa.  Dr. Thurmond Butts did state that "I am not sure this is truly Parkinson's."  Pt states that when she first went on it, "it worked really well."  However, as of beginning of October it wasn't working well.  Then on 10/21, she had gone to [facility] and just got off of the plane and had rapid onset of flushing and nausea.  She went to PCP and had some labs and told she would do a CT abdomen due to constipation.  Ever since this issue, her levodopa has not worked well.  She did got to UC and was told she had OH.  She had a few falls when in [place] over the summer.  With one she was pulling rhubarb out of the ground and fell when pulling.  With one, she was carrying a large pot and got off balance and fell.  She is off of levodopa and last took it at 7:30 pm.  She usually takes it tid at 7:30/1:30 and 7:30pm.    She can no longer ride a bike.  She can't swim.    08/10/18 update: Patient is seen today in follow-up for parkinsonism.  This patient is accompanied in the office by her spouse who supplements the history. Patient is on carbidopa/levodopa 25/100, 1 tablet 3 times per day.  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 .  I have personally reviewed those films.  We have had many phone calls and messages from the patient since our last visit.  She was worked in today to discuss some of these concerns.  C/o drooling.  C/o dry skin.  C/o cough.  Just went off of omeprazole.  Complains of nausea.  Trouble with handwriting Records are reviewed since our last visit, including records from primary care.  It is noted that she has requested transfer of care and has a new neurology appointment with [facility] , Dr. Doy Hutching, next week.   Allergies  Allergen Reactions  . Codeine     Makes Pt very "hyper"  . Sulfa Antibiotics     rash  . Wellbutrin [Bupropion] Other (See Comments)     Outpatient Encounter Medications as of 08/10/2018  Medication Sig  . carbidopa-levodopa (SINEMET IR) 25-100 MG tablet Take 1 tablet by mouth 3 (three) times daily.  . cholecalciferol (VITAMIN Johnson) 1000 units tablet Take 1,000 Units daily by mouth. 2000 units daily  . docusate sodium (COLACE) 100 MG capsule Take 1 capsule (100 mg total) by mouth 2 (two) times daily as needed for mild constipation.  . famotidine (PEPCID) 20 MG tablet Take 20  mg by mouth daily.  Marland Kitchen lamoTRIgine (LAMICTAL) 200 MG tablet Take 200 mg by mouth daily.  . pentosan polysulfate (ELMIRON) 100 MG capsule Take 100 mg by mouth daily.  . vitamin B-12 (CYANOCOBALAMIN) 1000 MCG tablet Take 1,000 mcg by mouth daily.  . [DISCONTINUED] AMBULATORY NON FORMULARY MEDICATION 2 tablets 2 (two) times daily. FD Donald Prose  . [DISCONTINUED] omeprazole (PRILOSEC) 40 MG capsule Take 1 capsule (40 mg total) by mouth daily. (Patient taking differently: Take 20 mg by mouth daily. )  . [DISCONTINUED] Probiotic Product (PROBIOTIC-10 PO) Take by mouth daily.  . [DISCONTINUED] sertraline (ZOLOFT) 100 MG tablet Take 150 mg by mouth daily.  . [DISCONTINUED] Iodine Strong (Lugols) 5 % solution 0.8 mL    No facility-administered encounter medications on file as of 08/10/2018.     Past Medical History:  Diagnosis Date  . Anxiety   . Colon cancer ([place])   . GERD (gastroesophageal reflux disease)   . Hyperlipemia   . Interstitial cystitis   . Parkinson disease ([place])   . Tremor     Past Surgical History:  Procedure Laterality Date  . COLECTOMY  2005  . COLONOSCOPY  2017  . INNER EAR SURGERY     multiple ear surgeries per patient    Social History   Socioeconomic History  . Marital status: Married    Spouse name: Not on file  . Number of children: 2  . Years of education: Not on file  . Highest education level: Not on file  Occupational History  . Occupation: Print production planner  Social Needs  . Financial resource strain: Not on file  . Food  insecurity:    Worry: Not on file    Inability: Not on file  . Transportation needs:    Medical: Not on file    Non-medical: Not on file  Tobacco Use  . Smoking status: Never Smoker  . Smokeless tobacco: Never Used  Substance and Sexual Activity  . Alcohol use: Yes    Comment: occ  . Drug use: No  . Sexual activity: Not on file  Lifestyle  . Physical activity:    Days per week: Not on file    Minutes per session: Not on file  . Stress: Not on file  Relationships  . Social connections:    Talks on phone: Not on file    Gets together: Not on file    Attends religious service: Not on file    Active member of club or organization: Not on file    Attends meetings of clubs or organizations: Not on file    Relationship status: Not on file  . Intimate partner violence:    Fear of current or ex partner: Not on file    Emotionally abused: Not on file    Physically abused: Not on file    Forced sexual activity: Not on file  Other Topics Concern  . Not on file  Social History Narrative  . Not on file    Family Status  Relation Name Status  . Mother  Deceased  . Father  Alive  . Sister x2 Alive  . Brother  Alive  . Son  Alive  . Daughter  Alive  . Mat Uncle  (Not Specified)  . Neg Hx  (Not Specified)    Review of Systems Review of Systems  Constitutional: Negative.   HENT: Negative.   Eyes: Negative.   Respiratory: Positive for cough.   Cardiovascular: Negative.   Gastrointestinal: Positive for nausea.  Skin: Negative.      Objective:   VITALS:   Vitals:   08/10/18 1030  BP: 104/64  Pulse: 82  SpO2: 95%  Weight: 175 lb (79.4 kg)  Height: 5\' 4"  (1.626 m)   GEN:  The patient appears stated age and is in NAD. HEENT:  Normocephalic, atraumatic.  The mucous membranes are moist. The superficial temporal arteries are without ropiness or tenderness. CV:  RRR Lungs:  CTAB Neck/HEME:  There are no carotid bruits bilaterally.  Neurological  examination:  Orientation: The patient is alert and oriented x3. Cranial nerves: There is good facial symmetry. The speech is fluent and clear. Soft palate rises symmetrically and there is no tongue deviation. Hearing is intact to conversational tone. Sensation: Sensation is intact to light touch throughout Motor: Strength is 5/5 in the bilateral upper and lower extremities.   Shoulder shrug is equal and symmetric.  There is no pronator drift.  Movement examination: Tone: There is good tone in the UE Abnormal movements: There is no tremor noted today Coordination:  There is decremation with RAM's, with any form of RAMS, including alternating supination and pronation of the forearm, hand opening and closing, finger taps, heel taps and toe taps on the right.  Gait and Station: The patient has no difficulty arising out of a deep-seated chair without the use of the hands. The patient's stride length is good.   Lab Results  Component Value Date   WBC 6.0 06/06/2018   HGB 12.4 06/06/2018   HCT 39.2 06/06/2018   MCV 90.5 06/06/2018   PLT 161 06/06/2018     Chemistry      Component Value Date/Time   NA 142 06/06/2018 1118   NA 143 05/09/2017 1343   NA 140 05/13/2016 1256   K 3.6 06/06/2018 1118   K 3.7 05/09/2017 1343   K 3.7 05/13/2016 1256   CL 106 06/06/2018 1118   CL 104 05/09/2017 1343   CO2 28 06/06/2018 1118   CO2 28 05/09/2017 1343   CO2 22 05/13/2016 1256   BUN 14 06/06/2018 1118   BUN 17 05/09/2017 1343   BUN 17.2 05/13/2016 1256   CREATININE 0.61 06/06/2018 1118   CREATININE 0.9 05/09/2017 1343   CREATININE 0.7 05/13/2016 1256      Component Value Date/Time   CALCIUM 9.4 06/06/2018 1118   CALCIUM 9.7 05/09/2017 1343   CALCIUM 9.2 05/13/2016 1256   ALKPHOS 65 06/06/2018 1118   ALKPHOS 64 05/09/2017 1343   ALKPHOS 81 05/13/2016 1256   AST 17 06/06/2018 1118   AST 16 05/13/2016 1256   ALT 4 06/06/2018 1118   ALT 24 05/09/2017 1343   ALT 20 05/13/2016 1256    BILITOT 0.4 06/06/2018 1118   BILITOT 0.54 05/13/2016 1256           Assessment/Plan:   1.  Parkinsonism.    -Patient looks much better today on levodopa.  She is currently on carbidopa/levodopa 25/100, 1 tablet 3 times per day.  She does think it wears off.  We decided to add entacapone, 1 tablet 3 times per day.  We discussed extensively risk, benefits, and side effects, including discoloration of urine and diarrhea.  She will let me know if she has any issues with the medication.  -DaT scan was positive.  Reviewed DaT scan with her.  Shown films at her request.  -Patient records have been reviewed and she has requested a transfer of care.  She has an appointment at [facility] with Dr. Doy Hutching on 08/15/18.    2.  Cough and nausea  -I wonder if this is related to reflux.  She took herself off of omeprazole because she did not want to be on so much medication.  She has had an extensive GI work-up since last visit including EGD and colonoscopy.  Advised her to follow back up with primary care.  I really do not think that this represents aspiration.  3.  Dry skin  -While this certainly could be related to parkinsonism and we discussed this today, this certainly could be related to the winter weather as well.  4.  She will be discharged to the full care of [facility] neurology.  Much greater than 50% of this visit was spent in counseling and coordinating care.  Total face to face time:  40 min  CC:  Rikki Spearing, NP

## 2018-08-09 MED FILL — ELMIRON 100 MG CAPSULE: 100 | 30 days supply | Qty: 90 | Fill #1

## 2018-08-10 ENCOUNTER — Encounter: Payer: Self-pay | Admitting: Neurology

## 2018-08-10 ENCOUNTER — Ambulatory Visit: Payer: 59 | Admitting: Neurology

## 2018-08-10 VITALS — BP 104/64 | HR 82 | Ht 64.0 in | Wt 175.0 lb

## 2018-08-10 DIAGNOSIS — L853 Xerosis cutis: Secondary | ICD-10-CM | POA: Diagnosis not present

## 2018-08-10 DIAGNOSIS — R110 Nausea: Secondary | ICD-10-CM | POA: Diagnosis not present

## 2018-08-10 DIAGNOSIS — G20 Parkinson's disease: Secondary | ICD-10-CM | POA: Diagnosis not present

## 2018-08-10 DIAGNOSIS — R05 Cough: Secondary | ICD-10-CM

## 2018-08-10 DIAGNOSIS — R059 Cough, unspecified: Secondary | ICD-10-CM

## 2018-08-10 MED ORDER — ENTACAPONE 200 MG PO TABS: 200.0000 mg | ORAL_TABLET | Freq: Three times a day (TID) | ORAL | 0 refills | Status: DC

## 2018-08-10 MED ORDER — CARBIDOPA-LEVODOPA 25-100 MG PO TABS: 1.0000 | ORAL_TABLET | Freq: Three times a day (TID) | ORAL | 0 refills | Status: DC

## 2018-08-10 MED FILL — ENTACAPONE 200 MG TABS: 200 | 30 days supply | Qty: 90 | Fill #0

## 2018-08-10 NOTE — Patient Instructions (Signed)
Continue Carbidopa Levodopa 1 tablet three times daily.  Start Entacapone (Comtan) 1 tablet three times daily (with each dose of Carbidopa Levodopa). Both prescriptions have been sent to your pharmacy.

## 2018-08-14 MED FILL — PRAVASTATIN SODIUM 10 MG TA: 10 | 30 days supply | Qty: 30 | Fill #0

## 2018-08-15 DIAGNOSIS — G20 Parkinson's disease: Secondary | ICD-10-CM | POA: Insufficient documentation

## 2018-08-15 MED FILL — rOPINIRole HCL 0.25 MG TABS: 0.25 | 30 days supply | Qty: 90 | Fill #0

## 2018-08-15 MED FILL — lamoTRIgine 200 MG TABS: 200 | 30 days supply | Qty: 30 | Fill #0

## 2018-08-28 ENCOUNTER — Ambulatory Visit (HOSPITAL_BASED_OUTPATIENT_CLINIC_OR_DEPARTMENT_OTHER)
Admission: RE | Admit: 2018-08-28 | Discharge: 2018-08-28 | Disposition: A | Discharge status: Home / Self Care | Payer: 59 | Source: Ambulatory Visit | Attending: Nurse Practitioner | Admitting: Nurse Practitioner

## 2018-08-28 ENCOUNTER — Encounter (HOSPITAL_BASED_OUTPATIENT_CLINIC_OR_DEPARTMENT_OTHER): Payer: Self-pay

## 2018-08-28 DIAGNOSIS — Z1231 Encounter for screening mammogram for malignant neoplasm of breast: Secondary | ICD-10-CM

## 2018-09-07 MED FILL — CARBIDOPA/LEVO 25/100 TAB: 25-100 | 30 days supply | Qty: 90 | Fill #1

## 2018-09-07 MED FILL — PRAVASTATIN SODIUM 10 MG TA: 10 | 30 days supply | Qty: 30 | Fill #1

## 2018-09-07 MED FILL — ELMIRON 100 MG CAPSULE: 100 | 30 days supply | Qty: 90 | Fill #2

## 2018-09-08 MED FILL — lamoTRIgine 200 MG TABS: 200 | 30 days supply | Qty: 30 | Fill #1

## 2018-09-08 MED FILL — PRAMIPEXOLE 0.125 MG TABLET: 0.125 | 30 days supply | Qty: 90 | Fill #0

## 2018-09-11 ENCOUNTER — Telehealth: Payer: Self-pay

## 2018-09-11 DIAGNOSIS — R110 Nausea: Secondary | ICD-10-CM

## 2018-09-11 MED ORDER — ONDANSETRON HCL 4 MG PO TABS: 4.0000 mg | ORAL_TABLET | Freq: Four times a day (QID) | ORAL | 5 refills | Status: DC | PRN

## 2018-09-11 MED FILL — ONDANSETRON HCL 4 MG TABLET: 4 | 21 days supply | Qty: 18 | Fill #0

## 2018-09-11 NOTE — Telephone Encounter (Signed)
Please see additional documentation concerning this patient 

## 2018-09-11 NOTE — Telephone Encounter (Signed)
Called and spoke with patient -patient informed of MD recommendations and verified pharmacy to which the RX needs to be sent; RX sent to pharmacy per MD recommendations; patient was advised to call back if questions/concerns arise; patient verbalized understanding of information/instructions;

## 2018-09-12 MED FILL — FLUoxetine HCL 20 MG TABS: 20 | 30 days supply | Qty: 30 | Fill #0

## 2018-09-26 MED FILL — CARBIDOPA/LEVO 25/100 TAB: 25-100 | 30 days supply | Qty: 150 | Fill #0

## 2018-10-02 ENCOUNTER — Encounter: Payer: Self-pay | Admitting: Hematology & Oncology

## 2018-10-02 MED FILL — lamoTRIgine 200 MG TABS: 200 | 30 days supply | Qty: 30 | Fill #2

## 2018-10-04 MED FILL — FLUoxetine HCL 40 MG CAPS: 40 | 30 days supply | Qty: 30 | Fill #0

## 2018-10-05 ENCOUNTER — Telehealth: Payer: Self-pay | Admitting: Hematology & Oncology

## 2018-10-05 ENCOUNTER — Inpatient Hospital Stay: Payer: 59

## 2018-10-05 ENCOUNTER — Inpatient Hospital Stay: Payer: 59 | Admitting: Hematology & Oncology

## 2018-10-05 NOTE — Telephone Encounter (Signed)
Called and LMVM for patient regarding appointment date changed from 4/2 to 10/15 per 4/1 sch msg

## 2018-10-19 MED FILL — AMANTADINE 100 MG CAPSULE: 100 | 30 days supply | Qty: 60 | Fill #0

## 2018-10-20 MED FILL — ELMIRON 100 MG CAPSULE: 100 | 30 days supply | Qty: 90 | Fill #3

## 2018-10-24 MED FILL — FLUoxetine HCL 20 MG CAPS: 20 | 30 days supply | Qty: 60 | Fill #0

## 2018-10-26 MED FILL — ONDANSETRON HCL 4 MG TABLET: 4 | 21 days supply | Qty: 18 | Fill #0

## 2018-10-26 MED FILL — CARBIDOPA/LEVO 25/100 TAB: 25-100 | 30 days supply | Qty: 90 | Fill #0

## 2018-10-27 ENCOUNTER — Ambulatory Visit: Payer: 59 | Admitting: Neurology

## 2018-10-31 ENCOUNTER — Telehealth: Payer: Self-pay

## 2018-11-01 ENCOUNTER — Other Ambulatory Visit: Payer: Self-pay

## 2018-11-01 ENCOUNTER — Encounter: Payer: Self-pay | Admitting: Gastroenterology

## 2018-11-01 ENCOUNTER — Telehealth (INDEPENDENT_AMBULATORY_CARE_PROVIDER_SITE_OTHER): Payer: 59 | Admitting: Gastroenterology

## 2018-11-01 DIAGNOSIS — K5900 Constipation, unspecified: Secondary | ICD-10-CM

## 2018-11-01 DIAGNOSIS — R110 Nausea: Secondary | ICD-10-CM

## 2018-11-01 DIAGNOSIS — Z85038 Personal history of other malignant neoplasm of large intestine: Secondary | ICD-10-CM | POA: Diagnosis not present

## 2018-11-01 DIAGNOSIS — G20 Parkinson's disease: Secondary | ICD-10-CM

## 2018-11-01 DIAGNOSIS — K219 Gastro-esophageal reflux disease without esophagitis: Secondary | ICD-10-CM

## 2018-11-01 DIAGNOSIS — Z860100 Personal history of colon polyps, unspecified: Secondary | ICD-10-CM

## 2018-11-01 DIAGNOSIS — G20A1 Parkinson's disease without dyskinesia, without mention of fluctuations: Secondary | ICD-10-CM

## 2018-11-01 MED ORDER — PROMETHAZINE HCL 12.5 MG PO TABS: 12.5000 mg | ORAL_TABLET | Freq: Four times a day (QID) | ORAL | 3 refills | Status: DC | PRN

## 2018-11-01 MED FILL — PROMETHAZINE 12.5 MG TABLET: 12.5 | 23 days supply | Qty: 90 | Fill #0

## 2018-11-01 NOTE — Progress Notes (Signed)
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  Constipation previously treated with enemas, MiraLAX, prune juice.  Seen in the [facility] ER 05/2018 with normal labs and CT, prescribed Bentyl, Valium, Reglan, MiraLAX.  Constipation attributed to combination of Parkinson's Disease along with Parkinson's medications.  Had been well controlled with Colace and Citrucel 1-2 times daily with soft, regular stools.  Colonoscopy with small tubular adenomas and a patent, healthy-appearing anastomosis, otherwise normal.  Constipation exacerbated by addition of Zofran for nausea in 10/2018.  Nausea also suspected secondary to Parkinson's medication (started after initiating Sinemet in 01/2018).  EGD otherwise largely unrevealing.  No improvement with Reglan.  Had been controlling with PPI, then exacerbation after starting Lamictal in 07/2018.  Due to ongoing nausea, was prescribed Zofran in 10/2018, which unfortunately exacerbated her constipation as above.  Extensive evaluation to date notable for normal 5 HIAA, urine porphobilinogen.  Normal CBC, CMP, lipase.  Normal CEA, B12.  Negative H. pylori.  EGD/colonoscopy completed 05/2018 as below.  Hx of reflux that is generally well controlled with Pepcid (and previously omeprazole).  EGD essentially normal 05/2017.  Endoscopic history: -Colonoscopy 03/2012: No report available -Colonoscopy 03/2013: No report available -Colonoscopy 08/2015: No report available - EGD (05/2018, Dr. Bryan Lemma): Normal esophagus, stomach, duodenum.  Biopsies with non-H. pylori gastritis, normal duodenum. -Colonoscopy (05/2018, Dr. Bryan Lemma): 8 mm flat cecal TA, 8 mm splenic flexure TA, 2 small (3 to 4 mm) TA   in sigmoid and descending colon.  Patent, healthy-appearing colocolonic anastomosis in the rectosigmoid.  Normal TI.  Recommended repeat in 1 year.   HPI:    Due to current restrictions/limitations of in-office visits due to the COVID-19 pandemic, this scheduled clinical appointment was converted to a telehealth virtual consultation using D[employer].  -The patient did consent to this virtual visit and is aware of possible charges through their insurance for this visit.  -Names of all parties present: De Nurse (patient), Gerrit Heck, DO, [facility] (physician) -Patient location: Home -Physician location: Office  HERMENIA FRITCHER is a 58 y.o. female with a history of Parkinson's presenting to the Gastroenterology Clinic for routine follow-up.  Initially seen in 05/2018 and last seen in 08/2017 for nausea, constipation, abdominal pain.  Nausea and abdominal pain have been improving, and constipation was controlled with Colace and Citrucel 1-2 times daily.  However due to ongoing nausea, was prescribed Zofran recently by Neurology, with subsequent worsening of constipation.  Otherwise, no hematochezia.  Colonoscopy completed in 05/2018 and notable for 4 subcentimeter tubular adenomas, patent colocolonic anastomosis, otherwise no obstructions.  She was hesitant about starting MiraLAX as a third agent for her constipation.  Now following with Dr. Doy Hutching and Dr. Tonye Royalty at [facility] for Parkinson's. Increased constipation when increased dose of Sinemet to 5/day. Now at 3/day.   Past medical history, past surgical history, social history, family history, medications, and allergies reviewed in the chart and with patient over [place].   Past Medical History:  Diagnosis Date  . Anxiety   . Colon cancer ([place])   . GERD (gastroesophageal reflux disease)   . Hyperlipemia   . Interstitial cystitis   . Parkinson disease ([place])   . Tremor  Past Surgical History:  Procedure Laterality  Date  . COLECTOMY  2005  . COLONOSCOPY  2017  . INNER EAR SURGERY     multiple ear surgeries per patient   Family History  Problem Relation Age of Onset  . Other Mother        ETOH  . Heart disease Father   . Hyperlipidemia Father   . Hypertension Sister   . Hypertension Brother   . Colon cancer Maternal Uncle   . Rectal cancer Maternal Uncle   . Esophageal cancer Neg Hx    Social History   Tobacco Use  . Smoking status: Never Smoker  . Smokeless tobacco: Never Used  Substance Use Topics  . Alcohol use: Yes    Comment: occ  . Drug use: No   Current Outpatient Medications  Medication Sig Dispense Refill  . carbidopa-levodopa (SINEMET IR) 25-100 MG tablet Take 1 tablet by mouth 3 (three) times daily. 270 tablet 0  . Cetirizine HCl (ZYRTEC PO) Take 1 tablet by mouth daily.    . cholecalciferol (VITAMIN D) 1000 units tablet Take 1,000 Units daily by mouth. 2000 units daily    . docusate sodium (COLACE) 100 MG capsule Take 1 capsule (100 mg total) by mouth 2 (two) times daily as needed for mild constipation. 60 capsule 11  . FLUoxetine (PROZAC) 40 MG capsule Take 40 mg by mouth daily.    . fluticasone (FLONASE) 50 MCG/ACT nasal spray Place 1 spray into both nostrils 2 (two) times daily.    Marland Kitchen lamoTRIgine (LAMICTAL) 200 MG tablet Take 200 mg by mouth daily.    . Melatonin 10 MG TABS Take 1 tablet by mouth daily.    . Methylcellulose, Laxative, (CITRUCEL PO) Take 1 tablet by mouth 2 (two) times daily.    Marland Kitchen omeprazole (PRILOSEC) 20 MG capsule Take 1 capsule by mouth daily.    . pentosan polysulfate (ELMIRON) 100 MG capsule Take 100 mg by mouth daily.    . pravastatin (PRAVACHOL) 10 MG tablet Take 10 mg by mouth daily.    . vitamin B-12 (CYANOCOBALAMIN) 1000 MCG tablet Take 1,000 mcg by mouth daily.    . ondansetron (ZOFRAN) 4 MG tablet Take 1 tablet (4 mg total) by mouth every 6 (six) hours as needed for nausea or vomiting. (Patient not taking: Reported on 11/01/2018) 30 tablet 5    No current facility-administered medications for this visit.    Allergies  Allergen Reactions  . Codeine     Makes Pt very "hyper"  . Sulfa Antibiotics     rash  . Wellbutrin [Bupropion] Other (See Comments)     Review of Systems: All systems reviewed and negative except where noted in HPI.     Physical Exam:    Physical exam not completed due to the nature of this telehealth communication.  Patient was otherwise alert and oriented and well communicative.   ASSESSMENT AND PLAN;   #1.Nauseawithout emesis: Near daily nausea without emesis secondary to Sinemet (5-20% ADR of nausea) and/or Lamictal (7 to 14% ADR of nausea),requiring regular use of antiemetics.  Unfortunately with exacerbation of constipation after starting Zofran (ADR of constipation in 6 to 11%). Given the critical need for this medication, plan to continue to treat symptomatically.   -Stop Zofran -Start Phenergan 12.5 mg as needed every 6 hours for nausea.  Advised to start taking qhs due to possible drowsiness, then titrate from there. -Reviewed medication ADR profile and ran medication through Lexi comp.  Class C interaction  with Lamictal and Sinemet.  Discussed this at length with the patient, and she understands and would like to proceed with this medication.  Similar class C warning with Zofran/Lamictal  #2.  GERD without esophagitis: Reflux symptoms well controlled on daily Pepcid.   No change at this time.  #3.Abdominal pain: Resolved with omeprazole and FD guard. Still controlled with daily Pepcid.  Can add peppermint alkaloids as needed (stopped FD guard due to high cost).    #4.  Constipation: Constipation as a listed ADR of Sinemet at approximately 6%, but there is also autonomic dysfunction with Parkinson's Disease, commonly manifesting as constipation, so either/both could be at play.  Constipation was well controlled with Colace 1-2 times daily and Citrucel 1-2 times daily, but unfortunately  was exacerbated by Zofran as above.  -Stopping Zofran -Continue Colace 100 mg as needed up to BID -Resume Citrucel -Okay to take MiraLAX as needed -Resume high fluid intake -Resume high-fiber diet - May need to consider trial of Motegrity in the future  #5.History of colon cancer: Prior history of colon cancer in 2004 treated in 2005 with recent colonoscopy notable for 4 subcentimeter Tubular Adenomas, with recommendation repeat in 1 year.  -Repeat colonoscopy in fall 2020 for ongoing surveillance.    #6.  Parkinson's Disease: Follows with neurology at [facility]   RTC in 3 to 6 months or sooner as needed  Lavena Bullion, DO, FACG  11/01/2018, 1:56 PM   Laymond Purser, Ewell Poe, NP

## 2018-11-01 NOTE — Telephone Encounter (Signed)
I saw that she was advised to start Miralax to use as needed and titrate to soft, regular stools. This was good advice and I agree with the trial of Miralax as recommended by [name]. Additionally, I am more than happy to offer a virtual office appointment so that Lindsay Johnson and I can go through the regimen and her response to treatment in detail and continue to work through all this. Could you please offer her an appt with me for next available? Can likely be seen today or tomorrow. Thank you.

## 2018-11-01 NOTE — Telephone Encounter (Signed)
The pt has been scheduled to speak with Dr C today at 11 am.  The pt has been advised of the information and verbalized understanding.

## 2018-11-13 MED FILL — CARBIDOPA/LEVO 25/100 TAB: 25-100 | 30 days supply | Qty: 150 | Fill #1

## 2018-11-16 MED FILL — METHYLPREDNISOLONE 4 MG TBP: 4 | 6 days supply | Qty: 21 | Fill #0

## 2018-11-17 MED FILL — diazePAM 5 MG TABS: 5 | 12 days supply | Qty: 25 | Fill #0

## 2018-11-20 MED FILL — lamoTRIgine 200 MG TABS: 200 | 30 days supply | Qty: 30 | Fill #0

## 2018-11-20 MED FILL — PRAVASTATIN SODIUM 10 MG TA: 10 | 30 days supply | Qty: 30 | Fill #0

## 2018-11-20 MED FILL — DOK 100 MG SOFTGEL: 100 | 50 days supply | Qty: 100 | Fill #1

## 2018-11-22 MED FILL — FLUoxetine HCL 20 MG CAPS: 20 | 30 days supply | Qty: 60 | Fill #1

## 2018-12-07 MED FILL — ELMIRON 100 MG CAPSULE: 100 | 30 days supply | Qty: 90 | Fill #4

## 2018-12-07 MED FILL — tiZANidine HCL 2 MG TABS: 2 | 7 days supply | Qty: 30 | Fill #0

## 2018-12-08 MED FILL — CARBIDOPA/LEVO 25/100 TAB: 25-100 | 30 days supply | Qty: 120 | Fill #0

## 2018-12-08 MED FILL — PRAVASTATIN NA 20 MG TAB: 20 | 30 days supply | Qty: 30 | Fill #0

## 2018-12-15 ENCOUNTER — Other Ambulatory Visit: Payer: Self-pay

## 2018-12-15 MED ORDER — PROMETHAZINE HCL 12.5 MG PO TABS: 12.5000 mg | ORAL_TABLET | Freq: Four times a day (QID) | ORAL | 3 refills | Status: DC | PRN

## 2018-12-19 MED ORDER — DOCUSATE SODIUM 100 MG PO CAPS: 100.0000 mg | ORAL_CAPSULE | Freq: Two times a day (BID) | ORAL | 3 refills | Status: AC | PRN

## 2018-12-20 MED FILL — lamoTRIgine 200 MG TABS: 200 | 30 days supply | Qty: 30 | Fill #0

## 2018-12-20 MED FILL — SERTRALINE HCL 25 MG TABLET: 25 | 30 days supply | Qty: 45 | Fill #0

## 2018-12-20 MED FILL — FLUoxetine HCL 20 MG CAPS: 20 | 14 days supply | Qty: 14 | Fill #0

## 2019-01-15 MED FILL — lamoTRIgine 200 MG TABS: 200 | 30 days supply | Qty: 30 | Fill #1

## 2019-01-31 DIAGNOSIS — F411 Generalized anxiety disorder: Secondary | ICD-10-CM | POA: Insufficient documentation

## 2019-03-14 DIAGNOSIS — F331 Major depressive disorder, recurrent, moderate: Secondary | ICD-10-CM | POA: Insufficient documentation

## 2019-03-14 MED FILL — DULoxetine HCL 30 MG CPEP: 30 | 30 days supply | Qty: 30 | Fill #0

## 2019-03-20 ENCOUNTER — Encounter: Payer: Self-pay | Admitting: Gastroenterology

## 2019-03-20 ENCOUNTER — Other Ambulatory Visit: Payer: Self-pay

## 2019-03-20 ENCOUNTER — Ambulatory Visit: Payer: 59 | Admitting: Gastroenterology

## 2019-03-20 VITALS — BP 104/70 | HR 96 | Temp 97.8°F | Ht 64.0 in | Wt 170.0 lb

## 2019-03-20 DIAGNOSIS — G20 Parkinson's disease: Secondary | ICD-10-CM

## 2019-03-20 DIAGNOSIS — R110 Nausea: Secondary | ICD-10-CM

## 2019-03-20 DIAGNOSIS — K5900 Constipation, unspecified: Secondary | ICD-10-CM | POA: Diagnosis not present

## 2019-03-20 DIAGNOSIS — R1084 Generalized abdominal pain: Secondary | ICD-10-CM

## 2019-03-20 DIAGNOSIS — Z85038 Personal history of other malignant neoplasm of large intestine: Secondary | ICD-10-CM | POA: Diagnosis not present

## 2019-03-20 DIAGNOSIS — K219 Gastro-esophageal reflux disease without esophagitis: Secondary | ICD-10-CM | POA: Diagnosis not present

## 2019-03-20 DIAGNOSIS — Z86010 Personal history of colonic polyps: Secondary | ICD-10-CM

## 2019-03-20 MED ORDER — FAMOTIDINE 20 MG PO TABS: 20.0000 mg | ORAL_TABLET | Freq: Two times a day (BID) | ORAL | 5 refills | Status: DC

## 2019-03-20 MED ORDER — FAMOTIDINE 20 MG PO TABS: 20.0000 mg | ORAL_TABLET | Freq: Two times a day (BID) | ORAL | 0 refills | Status: DC

## 2019-03-20 MED ORDER — NA SULFATE-K SULFATE-MG SULF 17.5-3.13-1.6 GM/177ML PO SOLN: 1.0000 | Freq: Once | ORAL | 0 refills | Status: AC

## 2019-03-20 MED FILL — FAMOTIDINE 20 MG TABLET: 20 | 7 days supply | Qty: 14 | Fill #0

## 2019-03-20 MED FILL — DOK 100 MG SOFTGEL: 100 | 50 days supply | Qty: 100 | Fill #2

## 2019-03-20 NOTE — Patient Instructions (Addendum)
If you are age 56 or older, your body mass index should be between 23-30. Your Body mass index is 29.18 kg/m. If this is out of the aforementioned range listed, please consider follow up with your Primary Care Provider.  If you are age 60 or younger, your body mass index should be between 19-25. Your Body mass index is 29.18 kg/m. If this is out of the aformentioned range listed, please consider follow up with your Primary Care Provider.   To help prevent the possible spread of infection to our patients, communities, and staff; we will be implementing the following measures:  As of now we are not allowing any visitors/family members to accompany you to any upcoming appointments with [facility]. If you have any concerns about this please contact our office to discuss prior to the appointment.   We have sent the following medications to your pharmacy for you to pick up at your convenience: Suprep  We have sent the following medications to your pharmacy for you to pick up at your convenience: Pepcid 20mg  by mouth twice a day.  It has been recommended to you by your physician that you have a(n) Colonoscopy completed. Per your request, we did not schedule the procedure(s) today. Please contact our office at [phone] should you decide to have the procedure completed.  It was a pleasure to see you today!  Vito Cirigliano, D.O.

## 2019-03-20 NOTE — Progress Notes (Signed)
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  Constipation previously treated with enemas, MiraLAX, prune juice.  Seen in the [facility] ER 05/2018 with normal labs and CT, prescribed Bentyl, Valium, Reglan, MiraLAX.  Constipation attributed to combination of Parkinson's Disease along with Parkinson's medications.  Had been well controlled with Colace and Citrucel 1-2 times daily with soft, regular stools.  Colonoscopy with small tubular adenomas and a patent, healthy-appearing anastomosis, otherwise normal.  Constipation exacerbated by addition of Zofran for nausea in 10/2018.  Nausea also suspected secondary to Parkinson's medication (started after initiating Sinemet in 01/2018).  EGD otherwise largely unrevealing.  No improvement with Reglan.  Had been controlling with PPI, then exacerbation after starting Lamictal in 07/2018.  Due to ongoing nausea, was prescribed Zofran in 10/2018, which unfortunately exacerbated her constipation as above.  Extensive evaluation to date notable for normal 5 HIAA, urine porphobilinogen.  Normal CBC, CMP, lipase.  Normal CEA, B12.  Negative H. pylori.  EGD/colonoscopy completed 05/2018 as below.  Hx of reflux that is generally well controlled with Pepcid (and previously omeprazole).  EGD essentially normal 05/2017.  Endoscopic history: -Colonoscopy 03/2012: No report available -Colonoscopy 03/2013: No report available -Colonoscopy 08/2015: No report available - EGD (05/2018, Dr. Bryan Lemma): Normal esophagus, stomach, duodenum.  Biopsies with non-H. pylori gastritis, normal duodenum. -Colonoscopy (05/2018, Dr. Bryan Lemma): 8 mm flat cecal TA, 8 mm splenic  flexure TA, 2 small (3 to 4 mm) TA  in sigmoid and descending colon.  Patent, healthy-appearing colocolonic anastomosis in the rectosigmoid.  Normal TI.  Recommended repeat in 1 year.   HPI:    Patient is a 58 y.o. female presenting to the Gastroenterology Clinic for follow-up.  Last seen in 10/2018 with ongoing nausea without emesis, presumed 2/2 medication ADR.  Stopped Zofran, and started Phenergan. She still has some nausea, but stopped anti-emetics for lack of efficacy. No vomiting. Weight stable.   Also with constipation, similarly thought 2/2 medication ADR with Parkinson's medications vs autonomic dysfunction 2/2 Parkinson's itself.  Controlled with Colace and Citrucel, with MiraLAX prn (very rare).   Reflux otherwise well controlled with Pepcid.  Abdominal pain controlled with Pepcid and intermittent use of peppermint Altoids prn (FD guard worked, but cost prohibitive).  Last colonoscopy in 2019 with four subcentimeter tubular adenomas, with recommended repeat in fall 2020 given history of CRC.   Review of systems:     No chest pain, no SOB, no fevers, no urinary sx   Past Medical History:  Diagnosis Date  . Anxiety   . Colon cancer ([place])   . GERD (gastroesophageal reflux disease)   . Hyperlipemia   . Interstitial cystitis   . Parkinson disease ([place])   . Tremor     Patient's surgical history, family medical history, social history, medications and allergies were all reviewed in Epic    Current Outpatient Medications  Medication Sig Dispense Refill  . Aloe Vera 25 MG CAPS Take 1 capsule by mouth daily.    . carbidopa-levodopa (SINEMET IR) 25-100 MG tablet Take 1 tablet by mouth 3 (three) times daily. 270 tablet 0  . Cetirizine HCl (ZYRTEC PO) Take 1 tablet by mouth daily.    . cholecalciferol (VITAMIN D) 1000 units tablet  Take 1,000 Units daily by mouth. 2000 units daily    . docusate sodium (COLACE) 100 MG capsule Take 1 capsule (100 mg total) by mouth 2 (two) times daily  as needed for mild constipation. 180 capsule 3  . famotidine (PEPCID) 20 MG tablet Take 20 mg by mouth 2 (two) times daily.    . fluticasone (FLONASE) 50 MCG/ACT nasal spray Place 1 spray into both nostrils 2 (two) times daily.    Marland Kitchen lamoTRIgine (LAMICTAL) 200 MG tablet Take 200 mg by mouth daily.    . Melatonin 10 MG TABS Take 1 tablet by mouth daily.    . Methylcellulose, Laxative, (CITRUCEL PO) Take 1 tablet by mouth 2 (two) times daily.    . pentosan polysulfate (ELMIRON) 100 MG capsule Take 100 mg by mouth daily.    . pravastatin (PRAVACHOL) 10 MG tablet Take 10 mg by mouth daily.    . sertraline (ZOLOFT) 100 MG tablet Take 100 mg by mouth as directed. 100mg  for 2 week and 50mg  for 2 weeks with cymbalta until the end of the 4 weeks    . vitamin B-12 (CYANOCOBALAMIN) 1000 MCG tablet Take 1,000 mcg by mouth daily.    . DULoxetine (CYMBALTA) 30 MG capsule 1 capsule daily.     No current facility-administered medications for this visit.     Physical Exam:     BP 104/70   Pulse 96   Temp 97.8 F (36.6 C)   Ht 5\' 4"  (1.626 m)   Wt 170 lb (77.1 kg)   BMI 29.18 kg/m   GENERAL:  Pleasant female in NAD PSYCH: : Cooperative, normal affect EENT:  conjunctiva pink, mucous membranes moist, neck supple without masses CARDIAC:  RRR, no murmur heard, no peripheral edema PULM: Normal respiratory effort, lungs CTA bilaterally, no wheezing ABDOMEN:  Nondistended, soft, nontender. No obvious masses, no hepatomegaly,  normal bowel sounds SKIN:  turgor, no lesions seen Musculoskeletal:  Normal muscle tone, normal strength NEURO: Alert and oriented x 3, no focal neurologic deficits   IMPRESSION and PLAN:     1) Nausea without emesis: -Potentially 2/2 medication ADR/polypharmacy - She has stopped all antiemetics due to lack of efficacy -Continue p.o. intake as tolerated -Will observe for improvement with recent medication changes by Psychiatry and Neurology  2) Constipation: -Constipation  as a listed ADR of Sinemet at approximately 6%, but there is also autonomic dysfunctionwithParkinson's Disease, commonly manifesting as constipation, so either/both could be at play.  - Otherwise well controlled with Colace and Citrucel -Very rare need for MiraLAX  3) History of colon cancer: Prior history of colon cancer in 2004 treated in 2005 with recent colonoscopy notable for 4 subcentimeter Tubular Adenomas, with recommendation repeat in 1 year. - Schedule repeat colonoscopy  4) GERD: -Well-controlled -Refilled Pepcid 20 mg BID -Resume antireflux lifestyle/dietary modifications  5) Abdominal pain: -Resolved/controlled with Pepcid  6) Parkinson's Disease: Follows [facility] neurology at [facility]  7) Depression: Follows with Psychiatry -Weaning off Zoloft, and starting Cymbalta   RTC in 6 months or sooner prn     I spent a total of 25 minutes of face-to-face time with the patient. Greater than 50% of the time was spent counseling and coordinating care.       Lavena Bullion ,DO, FACG 03/20/2019, 10:33 AM

## 2019-03-21 ENCOUNTER — Other Ambulatory Visit: Payer: Self-pay

## 2019-03-21 MED ORDER — FAMOTIDINE 20 MG PO TABS: 20.0000 mg | ORAL_TABLET | Freq: Two times a day (BID) | ORAL | 0 refills | Status: DC

## 2019-03-21 MED ORDER — FAMOTIDINE 20 MG PO TABS: 20.0000 mg | ORAL_TABLET | Freq: Two times a day (BID) | ORAL | 5 refills | Status: DC

## 2019-04-06 DIAGNOSIS — S22080A Wedge compression fracture of T11-T12 vertebra, initial encounter for closed fracture: Secondary | ICD-10-CM | POA: Insufficient documentation

## 2019-04-06 MED FILL — MELOXICAM 15 MG TABLET: 15 | 30 days supply | Qty: 30 | Fill #0

## 2019-04-13 ENCOUNTER — Other Ambulatory Visit (HOSPITAL_BASED_OUTPATIENT_CLINIC_OR_DEPARTMENT_OTHER): Payer: Self-pay | Admitting: Diagnostic Radiology

## 2019-04-13 ENCOUNTER — Other Ambulatory Visit (HOSPITAL_BASED_OUTPATIENT_CLINIC_OR_DEPARTMENT_OTHER): Payer: Self-pay | Admitting: Registered Nurse

## 2019-04-13 DIAGNOSIS — S22080A Wedge compression fracture of T11-T12 vertebra, initial encounter for closed fracture: Secondary | ICD-10-CM

## 2019-04-13 MED FILL — DULoxetine HCL 30 MG CPEP: 30 | 30 days supply | Qty: 60 | Fill #0

## 2019-04-19 ENCOUNTER — Telehealth: Payer: Self-pay | Admitting: *Deleted

## 2019-04-19 ENCOUNTER — Ambulatory Visit: Payer: 59 | Admitting: Hematology & Oncology

## 2019-04-19 ENCOUNTER — Other Ambulatory Visit: Payer: 59

## 2019-04-19 NOTE — Telephone Encounter (Signed)
Call received from patient stating that she had a potential exposure to COVID-19 last week, Thursday and would like to move out her appointments for today out by two weeks.  Patient transferred to scheduling and Dr. Marin Olp notified.

## 2019-04-20 MED FILL — NITROFURANTOIN MONO-MCR 100: 100 | 7 days supply | Qty: 14 | Fill #0

## 2019-04-21 ENCOUNTER — Ambulatory Visit (HOSPITAL_BASED_OUTPATIENT_CLINIC_OR_DEPARTMENT_OTHER): Payer: Managed Care, Other (non HMO)

## 2019-04-23 MED FILL — FLUCONAZOLE 150 MG TABS: 150 | 1 days supply | Qty: 1 | Fill #0

## 2019-04-25 ENCOUNTER — Encounter: Payer: Self-pay | Admitting: Gastroenterology

## 2019-05-07 ENCOUNTER — Other Ambulatory Visit: Payer: Self-pay | Admitting: *Deleted

## 2019-05-07 DIAGNOSIS — C189 Malignant neoplasm of colon, unspecified: Secondary | ICD-10-CM

## 2019-05-07 MED FILL — DULoxetine HCL 30 MG CPEP: 30 | 30 days supply | Qty: 60 | Fill #1

## 2019-05-08 ENCOUNTER — Inpatient Hospital Stay (HOSPITAL_BASED_OUTPATIENT_CLINIC_OR_DEPARTMENT_OTHER): Payer: 59 | Admitting: Hematology & Oncology

## 2019-05-08 ENCOUNTER — Other Ambulatory Visit: Payer: Self-pay

## 2019-05-08 ENCOUNTER — Encounter: Payer: Self-pay | Admitting: Hematology & Oncology

## 2019-05-08 ENCOUNTER — Inpatient Hospital Stay: Payer: 59 | Attending: Hematology & Oncology

## 2019-05-08 VITALS — BP 118/84 | HR 94 | Temp 97.7°F | Resp 16 | Wt 170.0 lb

## 2019-05-08 DIAGNOSIS — C189 Malignant neoplasm of colon, unspecified: Secondary | ICD-10-CM | POA: Diagnosis not present

## 2019-05-08 DIAGNOSIS — G20 Parkinson's disease: Secondary | ICD-10-CM | POA: Insufficient documentation

## 2019-05-08 DIAGNOSIS — Z85038 Personal history of other malignant neoplasm of large intestine: Secondary | ICD-10-CM | POA: Insufficient documentation

## 2019-05-08 DIAGNOSIS — Z79899 Other long term (current) drug therapy: Secondary | ICD-10-CM | POA: Insufficient documentation

## 2019-05-08 DIAGNOSIS — Z9221 Personal history of antineoplastic chemotherapy: Secondary | ICD-10-CM | POA: Insufficient documentation

## 2019-05-08 LAB — CBC WITH DIFFERENTIAL (CANCER CENTER ONLY)
Abs Immature Granulocytes: 0.02 10*3/uL (ref 0.00–0.07)
Basophils Absolute: 0 10*3/uL (ref 0.0–0.1)
Basophils Relative: 0 %
Eosinophils Absolute: 0 10*3/uL (ref 0.0–0.5)
Eosinophils Relative: 1 %
HCT: 39.9 % (ref 36.0–46.0)
Hemoglobin: 12.7 g/dL (ref 12.0–15.0)
Immature Granulocytes: 0 %
Lymphocytes Relative: 22 %
Lymphs Abs: 1.1 10*3/uL (ref 0.7–4.0)
MCH: 28.6 pg (ref 26.0–34.0)
MCHC: 31.8 g/dL (ref 30.0–36.0)
MCV: 89.9 fL (ref 80.0–100.0)
Monocytes Absolute: 0.3 10*3/uL (ref 0.1–1.0)
Monocytes Relative: 6 %
Neutro Abs: 3.5 10*3/uL (ref 1.7–7.7)
Neutrophils Relative %: 71 %
Platelet Count: 169 10*3/uL (ref 150–400)
RBC: 4.44 MIL/uL (ref 3.87–5.11)
RDW: 12.7 % (ref 11.5–15.5)
WBC Count: 5 10*3/uL (ref 4.0–10.5)
nRBC: 0 % (ref 0.0–0.2)

## 2019-05-08 LAB — COMPREHENSIVE METABOLIC PANEL
ALT: 2 U/L (ref 0–44)
AST: 10 U/L — ABNORMAL LOW (ref 15–41)
Albumin: 4.8 g/dL (ref 3.5–5.0)
Alkaline Phosphatase: 74 U/L (ref 38–126)
Anion gap: 8 (ref 5–15)
BUN: 19 mg/dL (ref 6–20)
CO2: 30 mmol/L (ref 22–32)
Calcium: 9.8 mg/dL (ref 8.9–10.3)
Chloride: 105 mmol/L (ref 98–111)
Creatinine, Ser: 0.7 mg/dL (ref 0.44–1.00)
GFR calc Af Amer: >60 mL/min (ref >60)
GFR calc non Af Amer: >60 mL/min (ref >60)
Glucose, Bld: 110 mg/dL — ABNORMAL HIGH (ref 70–99)
Potassium: 3.7 mmol/L (ref 3.5–5.1)
Sodium: 143 mmol/L (ref 135–145)
Total Bilirubin: 0.7 mg/dL (ref 0.3–1.2)
Total Protein: 6.8 g/dL (ref 6.5–8.1)

## 2019-05-08 LAB — CEA (IN HOUSE-CHCC): CEA (CHCC-In House): 1.86 ng/mL (ref 0.00–5.00)

## 2019-05-08 NOTE — Progress Notes (Signed)
Hematology and Oncology Follow Up Visit  JAEL KESTER [id number] [birth year] 58 y.o. 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 Denman is back for follow-up.  She is doing okay.  She is managing the Parkinson's okay.  The Parkinson's is really her biggest problem.  She is on Sinemet for this.  Her daughter just got married.  This was this past weekend.  This was a good time for her.  This was up in the [place].  Her son is getting married next year.  This will be in March.  She is not working any longer.  She really cannot work because of the Parkinson's.  She did go up to [place] state for part of the summer.  She does this all the time.  She visits family up there.  She enjoyed herself.  She goes for colonoscopy in January.  She has polyps.  Dr. Bryan Lemma is doing a very thorough job with this and following her very closely.  Her children are ages 4 and 10.  They will have colonoscopies when they are 56 years old.  She has had no problems with cough.  There is no rashes.  There is no bleeding.  She has had no obvious change in bowel or bladder habits.  She has had no leg swelling.  Overall, I would say her performance status is ECOG 1.       Medications:  Current Outpatient Medications:  .  Carbidopa 25 MG tablet, Take 25 mg by mouth 4 (four) times daily., Disp: , Rfl:  .  DULoxetine (CYMBALTA) 30 MG capsule, Take 30 mg by mouth daily. Take two capsules, total of 60 mg by mouth every morning., Disp: , Rfl:  .  lamoTRIgine (LAMICTAL) 200 MG tablet, Take 200 mg by mouth daily., Disp: , Rfl:  .  pravastatin (PRAVACHOL) 20 MG tablet, Take 20 mg by mouth daily., Disp: , Rfl:  .  Aloe Vera 25 MG CAPS, Take 1 capsule by mouth daily., Disp: , Rfl:  .  carbidopa-levodopa (SINEMET IR) 25-100 MG tablet, Take 1 tablet by mouth 3 (three) times daily.,  Disp: 270 tablet, Rfl: 0 .  Cetirizine HCl (ZYRTEC PO), Take 1 tablet by mouth daily., Disp: , Rfl:  .  cholecalciferol (VITAMIN D) 1000 units tablet, Take 1,000 Units daily by mouth. 2000 units daily, Disp: , Rfl:  .  docusate sodium (COLACE) 100 MG capsule, Take 1 capsule (100 mg total) by mouth 2 (two) times daily as needed for mild constipation., Disp: 180 capsule, Rfl: 3 .  famotidine (PEPCID) 20 MG tablet, Take 1 tablet (20 mg total) by mouth 2 (two) times daily for 7 days., Disp: 14 tablet, Rfl: 0 .  Melatonin 10 MG TABS, Take 1 tablet by mouth daily., Disp: , Rfl:  .  Methylcellulose, Laxative, (CITRUCEL PO), Take 1 tablet by mouth 2 (two) times daily., Disp: , Rfl:  .  pentosan polysulfate (ELMIRON) 100 MG capsule, Take 100 mg by mouth daily., Disp: , Rfl:  .  vitamin B-12 (CYANOCOBALAMIN) 1000 MCG tablet, Take 1,000 mcg by mouth daily., Disp: , Rfl:   Allergies:  Allergies  Allergen Reactions  . Codeine     Makes Pt very "hyper"  . Sulfa Antibiotics     rash  . Wellbutrin [Bupropion] Other (See Comments)    Past Medical History, Surgical  history, Social history, and Family History were reviewed and updated.  Review of System:   Review of Systems  Constitutional: Positive for malaise/fatigue.  HENT: Negative.   Eyes: Negative.   Respiratory: Negative.  Negative for cough.   Cardiovascular: Negative.   Gastrointestinal: Positive for heartburn and nausea.  Genitourinary: Negative.   Musculoskeletal: Positive for joint pain and myalgias.  Skin: Negative.   Neurological: Positive for tremors.  Endo/Heme/Allergies: Negative.   Psychiatric/Behavioral: Negative.      Physical Exam:  weight is 170 lb (77.1 kg). Her temporal temperature is 97.7 F (36.5 C). Her blood pressure is 118/84 and her pulse is 94. Her respiration is 16 and oxygen saturation is 100%.   Wt Readings from Last 3 Encounters:  05/08/19 170 lb (77.1 kg)  03/20/19 170 lb (77.1 kg)  08/10/18 175 lb (79.4  kg)     Physical Exam Vitals signs reviewed.  HENT:     Head: Normocephalic and atraumatic.  Eyes:     Pupils: Pupils are equal, round, and reactive to light.  Neck:     Musculoskeletal: Normal range of motion.  Cardiovascular:     Rate and Rhythm: Normal rate and regular rhythm.     Heart sounds: Normal heart sounds.  Pulmonary:     Effort: Pulmonary effort is normal.     Breath sounds: Normal breath sounds.  Abdominal:     General: Bowel sounds are normal.     Palpations: Abdomen is soft.  Musculoskeletal: Normal range of motion.        General: No tenderness or deformity.  Lymphadenopathy:     Cervical: No cervical adenopathy.  Skin:    General: Skin is warm and dry.     Findings: No erythema or rash.  Neurological:     Mental Status: She is alert and oriented to person, place, and time.  Psychiatric:        Behavior: Behavior normal.        Thought Content: Thought content normal.        Judgment: Judgment normal.      Lab Results  Component Value Date   WBC 5.0 05/08/2019   HGB 12.7 05/08/2019   HCT 39.9 05/08/2019   MCV 89.9 05/08/2019   PLT 169 05/08/2019     Chemistry      Component Value Date/Time   NA 143 05/08/2019 1038   NA 143 05/09/2017 1343   NA 140 05/13/2016 1256   K 3.7 05/08/2019 1038   K 3.7 05/09/2017 1343   K 3.7 05/13/2016 1256   CL 105 05/08/2019 1038   CL 104 05/09/2017 1343   CO2 30 05/08/2019 1038   CO2 28 05/09/2017 1343   CO2 22 05/13/2016 1256   BUN 19 05/08/2019 1038   BUN 17 05/09/2017 1343   BUN 17.2 05/13/2016 1256   CREATININE 0.70 05/08/2019 1038   CREATININE 0.61 06/06/2018 1118   CREATININE 0.9 05/09/2017 1343   CREATININE 0.7 05/13/2016 1256      Component Value Date/Time   CALCIUM 9.8 05/08/2019 1038   CALCIUM 9.7 05/09/2017 1343   CALCIUM 9.2 05/13/2016 1256   ALKPHOS 74 05/08/2019 1038   ALKPHOS 64 05/09/2017 1343   ALKPHOS 81 05/13/2016 1256   AST 10 (L) 05/08/2019 1038   AST 17 06/06/2018 1118    AST 16 05/13/2016 1256   ALT 2 05/08/2019 1038   ALT 4 06/06/2018 1118   ALT 24 05/09/2017 1343   ALT 20 05/13/2016 1256  BILITOT 0.7 05/08/2019 1038   BILITOT 0.4 06/06/2018 1118   BILITOT 0.54 05/13/2016 1256      Impression and Plan: Ms. Arnott is a 58 year old white female. She had stage II invasive colon cancer 15 years ago. She underwent resection. She underwent adjuvant chemotherapy with FOLFOX.  I feel bad that she does have the Parkinson's.  Hopefully, this will not progress too quickly.  I do not see any issue with respect to her having any kind of recurrence.  We will plan to get her back in another year.  By then, her son will be married.  She is looking forward to the possibility of being a grandparent.  I did show her pictures of my granddaughter.     [name], MD 11/3/202011:33 AM

## 2019-05-15 MED FILL — ENTACAPONE 200 MG TABS: 200 | 30 days supply | Qty: 120 | Fill #0

## 2019-05-29 ENCOUNTER — Encounter: Payer: Self-pay | Admitting: Gastroenterology

## 2019-06-08 MED FILL — BETAMETHASONE DP 0.05% OINT: 0.05 | 30 days supply | Qty: 45 | Fill #0

## 2019-06-18 DIAGNOSIS — R471 Dysarthria and anarthria: Secondary | ICD-10-CM | POA: Insufficient documentation

## 2019-06-21 MED FILL — CARBIDOPA 25 MG TABS: 25 | 30 days supply | Qty: 120 | Fill #0

## 2019-06-21 MED FILL — CARBIDOPA/LEVO 25/100 TAB: 25-100 | 30 days supply | Qty: 120 | Fill #1

## 2019-07-02 ENCOUNTER — Ambulatory Visit (AMBULATORY_SURGERY_CENTER): Payer: 59 | Admitting: *Deleted

## 2019-07-02 ENCOUNTER — Other Ambulatory Visit: Payer: Self-pay

## 2019-07-02 VITALS — Ht 64.0 in | Wt 170.0 lb

## 2019-07-02 DIAGNOSIS — Z1159 Encounter for screening for other viral diseases: Secondary | ICD-10-CM

## 2019-07-02 DIAGNOSIS — Z85038 Personal history of other malignant neoplasm of large intestine: Secondary | ICD-10-CM

## 2019-07-02 DIAGNOSIS — Z86010 Personal history of colonic polyps: Secondary | ICD-10-CM

## 2019-07-02 NOTE — Addendum Note (Signed)
Addended by: Steva Ready on: 07/02/2019 03:19 PM   Modules accepted: Orders

## 2019-07-02 NOTE — Progress Notes (Signed)
Pt verified name, DOB, address and insurance during PV today. Pt mailed instruction packet to included paper to complete and mail back to [facility] with addressed and stamped envelope, Emmi video, copy of consent form to read and not return, and instructions.pt requested Miralax prep as she cannot drink a prescription prep per pt-  PV completed over the phone. Pt encouraged to call with questions or issues   No egg or soy allergy known to patient  No issues with past sedation with any surgeries  or procedures, no intubation problems  No diet pills per patient No home 02 use per patient  No blood thinners per patient  Pt denies issues with constipation  No A fib or A flutter  EMMI video sent to pt's e mail   Due to the COVID-19 pandemic we are asking patients to follow these guidelines. Please only bring one care partner. Please be aware that your care partner may wait in the car in the parking lot or if they feel like they will be too hot to wait in the car, they may wait in the lobby on the 4th floor. All care partners are required to wear a mask the entire time (we do not have any that we can provide them), they need to practice social distancing, and we will do a Covid check for all patient's and care partners when you arrive. Also we will check their temperature and your temperature. If the care partner waits in their car they need to stay in the parking lot the entire time and we will call them on their cell phone when the patient is ready for discharge so they can bring the car to the front of the building. Also all patient's will need to wear a mask into building.

## 2019-07-09 ENCOUNTER — Encounter: Payer: Self-pay | Admitting: Gastroenterology

## 2019-07-12 ENCOUNTER — Ambulatory Visit (INDEPENDENT_AMBULATORY_CARE_PROVIDER_SITE_OTHER): Payer: 59

## 2019-07-12 ENCOUNTER — Other Ambulatory Visit: Payer: Self-pay | Admitting: Gastroenterology

## 2019-07-12 DIAGNOSIS — Z1159 Encounter for screening for other viral diseases: Secondary | ICD-10-CM

## 2019-07-12 LAB — SARS CORONAVIRUS 2 (TAT 6-24 HRS): SARS Coronavirus 2: NEGATIVE

## 2019-07-16 ENCOUNTER — Ambulatory Visit (AMBULATORY_SURGERY_CENTER): Payer: 59 | Admitting: Gastroenterology

## 2019-07-16 ENCOUNTER — Other Ambulatory Visit: Payer: Self-pay

## 2019-07-16 ENCOUNTER — Encounter: Payer: Self-pay | Admitting: Gastroenterology

## 2019-07-16 VITALS — BP 138/73 | HR 92 | Temp 97.5°F | Resp 22 | Ht 64.0 in | Wt 170.0 lb

## 2019-07-16 DIAGNOSIS — D124 Benign neoplasm of descending colon: Secondary | ICD-10-CM | POA: Diagnosis not present

## 2019-07-16 DIAGNOSIS — Z85038 Personal history of other malignant neoplasm of large intestine: Secondary | ICD-10-CM

## 2019-07-16 DIAGNOSIS — K640 First degree hemorrhoids: Secondary | ICD-10-CM

## 2019-07-16 MED ORDER — SODIUM CHLORIDE 0.9 % IV SOLN: 500.0000 mL | Freq: Once | INTRAVENOUS | Status: DC

## 2019-07-16 MED FILL — CARBIDOPA 25 MG TABS: 25 | 30 days supply | Qty: 120 | Fill #1

## 2019-07-16 MED FILL — CARBIDOPA/LEVO 25/100 TAB: 25-100 | 30 days supply | Qty: 165 | Fill #0

## 2019-07-16 NOTE — Progress Notes (Signed)
A/ox3, pleased with MAC, report to RN 

## 2019-07-16 NOTE — Progress Notes (Signed)
Called to room to assist during endoscopic procedure.  Patient ID and intended procedure confirmed with present staff. Received instructions for my participation in the procedure from the performing physician.  

## 2019-07-16 NOTE — Patient Instructions (Signed)
YOU HAD AN ENDOSCOPIC PROCEDURE TODAY AT [facility]:   Refer to the procedure report that was given to you for any specific questions about what was found during the examination.  If the procedure report does not answer your questions, please call your gastroenterologist to clarify.  If you requested that your care partner not be given the details of your procedure findings, then the procedure report has been included in a sealed envelope for you to review at your convenience later.  **Handouts given on polyps and hemorrhoids**   YOU SHOULD EXPECT: Some feelings of bloating in the abdomen. Passage of more gas than usual.  Walking can help get rid of the air that was put into your GI tract during the procedure and reduce the bloating. If you had a lower endoscopy (such as a colonoscopy or flexible sigmoidoscopy) you may notice spotting of blood in your stool or on the toilet paper. If you underwent a bowel prep for your procedure, you may not have a normal bowel movement for a few days.  Please Note:  You might notice some irritation and congestion in your nose or some drainage.  This is from the oxygen used during your procedure.  There is no need for concern and it should clear up in a day or so.  SYMPTOMS TO REPORT IMMEDIATELY:   Following lower endoscopy (colonoscopy or flexible sigmoidoscopy):  Excessive amounts of blood in the stool  Significant tenderness or worsening of abdominal pains  Swelling of the abdomen that is new, acute  Fever of 100F or higher    For urgent or emergent issues, a gastroenterologist can be reached at any hour by calling [phone].   DIET:  We do recommend a small meal at first, but then you may proceed to your regular diet.  Drink plenty of fluids but you should avoid alcoholic beverages for 24 hours.  ACTIVITY:  You should plan to take it easy for the rest of today and you should NOT DRIVE or use heavy machinery until tomorrow (because  of the sedation medicines used during the test).    FOLLOW UP: Our staff will call the number listed on your records 48-72 hours following your procedure to check on you and address any questions or concerns that you may have regarding the information given to you following your procedure. If we do not reach you, we will leave a message.  We will attempt to reach you two times.  During this call, we will ask if you have developed any symptoms of COVID 19. If you develop any symptoms (ie: fever, flu-like symptoms, shortness of breath, cough etc.) before then, please call [phone].  If you test positive for Covid 19 in the 2 weeks post procedure, please call and report this information to [place].    If any biopsies were taken you will be contacted by phone or by letter within the next 1-3 weeks.  Please call us at [phone] if you have not heard about the biopsies in 3 weeks.    SIGNATURES/CONFIDENTIALITY: You and/or your care partner have signed paperwork which will be entered into your electronic medical record.  These signatures attest to the fact that that the information above on your After Visit Summary has been reviewed and is understood.  Full responsibility of the confidentiality of this discharge information lies with you and/or your care-partner.

## 2019-07-16 NOTE — Progress Notes (Signed)
Temp-JB VS-CW  Pt's states no medical or surgical changes since previsit or office visit.  

## 2019-07-16 NOTE — Op Note (Signed)
[facility] Patient Name: Lindsay Johnson Procedure Date: 07/16/2019 7:57 AM MRN: [id number] Endoscopist: Gerrit Heck , MD Age: 59 Referring MD:  Date of Birth: Jan 15, 1961 Gender: Female Account #: [id number] Procedure:                Colonoscopy Indications:              High risk colon cancer surveillance: Personal                            history of multiple (3 or more) adenomas, , High                            risk colon cancer surveillance: Personal history of                            colon cancer                           59 yo female with a history ofcolon cancer(stage                            II Adenocarcinoma of the sigmoid colon diagnosed                            06/2003, left hemicolectomy and chemotherapy 2005).                            Colonoscopy in 05/2018 notable for 8 mm flat cecal                            TA, 8 mm splenic flexure TA, 2 small (3 to 4 mm) TA                            in sigmoid and descending colon. Patent,                            healthy-appearing colocolonic anastomosis in the                            rectosigmoid. Normal TI. Recommended repeat in 1                            year. Medicines:                Monitored Anesthesia Care Procedure:                Pre-Anesthesia Assessment:                           - Prior to the procedure, a History and Physical                            was performed, and patient medications and  allergies were reviewed. The patient's tolerance of                            previous anesthesia was also reviewed. The risks                            and benefits of the procedure and the sedation                            options and risks were discussed with the patient.                            All questions were answered, and informed consent                            was obtained. Prior Anticoagulants: The patient has   taken no previous anticoagulant or antiplatelet                            agents. ASA Grade Assessment: II - A patient with                            mild systemic disease. After reviewing the risks                            and benefits, the patient was deemed in                            satisfactory condition to undergo the procedure.                           After obtaining informed consent, the colonoscope                            was passed under direct vision. Throughout the                            procedure, the patient's blood pressure, pulse, and                            oxygen saturations were monitored continuously. The                            Colonoscope was introduced through the anus and                            advanced to the the terminal ileum. The colonoscopy                            was performed without difficulty. The patient                            tolerated the procedure well. The quality of the  bowel preparation was excellent. The terminal                            ileum, ileocecal valve, appendiceal orifice, and                            rectum were photographed. Scope In: 8:07:12 AM Scope Out: 8:22:46 AM Scope Withdrawal Time: 0 hours 12 minutes 40 seconds  Total Procedure Duration: 0 hours 15 minutes 34 seconds  Findings:                 The perianal and digital rectal examinations were                            normal.                           A 3 mm polyp was found in the transverse/descending                            colon. The polyp was sessile. The polyp was removed                            with a cold snare. Resection and retrieval were                            complete. Estimated blood loss was minimal.                           There was evidence of a prior end-to-end                            colo-colonic anastomosis in the recto-sigmoid                            colon. This was patent and  was characterized by                            healthy appearing mucosa. The anastomosis was                            traversed.                           Non-bleeding internal hemorrhoids were found during                            retroflexion. The hemorrhoids were small.                           The exam was otherwise normal throughout the                            remainder of the colon.  The terminal ileum appeared normal. Complications:            No immediate complications. Estimated Blood Loss:     Estimated blood loss was minimal. Impression:               - One 3 mm polyp in the descending colon, removed                            with a cold snare. Resected and retrieved.                           - Patent end-to-end colo-colonic anastomosis,                            characterized by healthy appearing mucosa.                           - Non-bleeding internal hemorrhoids.                           - The examined portion of the ileum was normal. Recommendation:           - Patient has a contact number available for                            emergencies. The signs and symptoms of potential                            delayed complications were discussed with the                            patient. Return to normal activities tomorrow.                            Written discharge instructions were provided to the                            patient.                           - Resume previous diet.                           - Continue present medications.                           - Await pathology results.                           - Repeat colonoscopy in 3 years for surveillance.                           - Return to GI office PRN.                           - Internal hemorrhoids were noted on this study and  may be amenable to hemorrhoid band ligation. If you                            are interested in further treatment  of these                            hemorrhoids with band ligation, please contact my                            clinic to set up an appointment for evaluation and                            treatment. Gerrit Heck, MD 07/16/2019 8:33:26 AM

## 2019-07-18 ENCOUNTER — Telehealth: Payer: Self-pay

## 2019-07-18 NOTE — Telephone Encounter (Signed)
Pt. Called back regarding inability to pass gas and some cramping 1/10 pain.  Advised to take GasX,  drink warm liquids, walk, put heating pad on abdomen or rock back and forth on all 4s to encourage passage of flatus.  Also advised to call back with any increase of pain or difficulties.  Pt stated understanding of all.

## 2019-07-18 NOTE — Telephone Encounter (Signed)
1. Have you developed a fever since your procedure? No  2.   Have you had an respiratory symptoms (SOB or cough) since your procedure? No  3.   Have you tested positive for COVID 19 since your procedure No  4.   Have you had any family members/close contacts diagnosed with the COVID 19 since your procedure?  No   If yes to any of these questions please route to Joylene John, RN and Alphonsa Gin, RN.   Follow up Call-  Call back number 07/16/2019 07/16/2019 05/17/2018  Post procedure Call Back phone  # [phone]  Permission to leave phone message Yes - Yes  Some recent data might be hidden     Patient questions:  Do you have a fever, pain , or abdominal swelling? No. Pain Score  0 *  Have you tolerated food without any problems? Yes.    Have you been able to return to your normal activities? Yes.    Do you have any questions about your discharge instructions: Diet   No. Medications  No. Follow up visit  No.  Do you have questions or concerns about your Care? No.  Actions: * If pain score is 4 or above: No action needed, pain <4.

## 2019-07-19 ENCOUNTER — Other Ambulatory Visit (HOSPITAL_BASED_OUTPATIENT_CLINIC_OR_DEPARTMENT_OTHER): Payer: Self-pay | Admitting: Nurse Practitioner

## 2019-07-19 ENCOUNTER — Encounter: Payer: Self-pay | Admitting: Gastroenterology

## 2019-07-19 DIAGNOSIS — Z1231 Encounter for screening mammogram for malignant neoplasm of breast: Secondary | ICD-10-CM

## 2019-08-08 MED FILL — CARBIDOPA 25 MG TABS: 25 | 30 days supply | Qty: 120 | Fill #2

## 2019-08-30 ENCOUNTER — Ambulatory Visit (HOSPITAL_BASED_OUTPATIENT_CLINIC_OR_DEPARTMENT_OTHER)
Admission: RE | Admit: 2019-08-30 | Discharge: 2019-08-30 | Disposition: A | Discharge status: Home / Self Care | Payer: 59 | Source: Ambulatory Visit | Attending: Nurse Practitioner | Admitting: Nurse Practitioner

## 2019-08-30 ENCOUNTER — Other Ambulatory Visit: Payer: Self-pay

## 2019-08-30 DIAGNOSIS — Z1231 Encounter for screening mammogram for malignant neoplasm of breast: Secondary | ICD-10-CM | POA: Insufficient documentation

## 2019-08-30 MED FILL — FLUoxetine HCL 10 MG CAPS: 10 | 31 days supply | Qty: 60 | Fill #0

## 2019-09-14 ENCOUNTER — Other Ambulatory Visit: Payer: Self-pay | Admitting: Nurse Practitioner

## 2019-09-14 DIAGNOSIS — R2232 Localized swelling, mass and lump, left upper limb: Secondary | ICD-10-CM

## 2019-09-28 MED FILL — FLUoxetine HCL 10 MG CAPS: 10 | 31 days supply | Qty: 60 | Fill #1

## 2019-10-09 ENCOUNTER — Ambulatory Visit
Admission: RE | Admit: 2019-10-09 | Discharge: 2019-10-09 | Disposition: A | Discharge status: Home / Self Care | Payer: 59 | Source: Ambulatory Visit | Attending: Nurse Practitioner | Admitting: Nurse Practitioner

## 2019-10-09 ENCOUNTER — Other Ambulatory Visit: Payer: Self-pay | Admitting: Nurse Practitioner

## 2019-10-09 ENCOUNTER — Other Ambulatory Visit: Payer: Self-pay

## 2019-10-09 DIAGNOSIS — R2232 Localized swelling, mass and lump, left upper limb: Secondary | ICD-10-CM

## 2019-10-18 MED FILL — FLUoxetine HCL 40 MG CAPS: 40 | 30 days supply | Qty: 30 | Fill #0

## 2019-10-25 MED FILL — METHYLPREDNISOLONE 4 MG TBP: 4 | 6 days supply | Qty: 21 | Fill #0

## 2019-10-30 MED FILL — RYTARY 23.75-95 MG CPCR: 23.75-95 | 30 days supply | Qty: 360 | Fill #0

## 2019-11-08 MED FILL — CARBIDOPA-LEVO ER 25-100 TA: 25-100 | 30 days supply | Qty: 90 | Fill #0

## 2019-11-19 MED FILL — FLUoxetine HCL 40 MG CAPS: 40 | 30 days supply | Qty: 30 | Fill #0

## 2019-11-21 MED FILL — LIDOCAINE HCL 2% JELLY: 2 | 15 days supply | Qty: 30 | Fill #0

## 2020-03-17 IMAGING — MG DIGITAL SCREENING BILAT W/ TOMO W/ CAD
6 of 10 series · 6 of 30 positions shown · non-contrast
Comparison: Previous exam(s).

CLINICAL DATA: Screening.

EXAM:
DIGITAL SCREENING BILATERAL MAMMOGRAM WITH TOMO AND CAD

[L XCCL synth-2D]
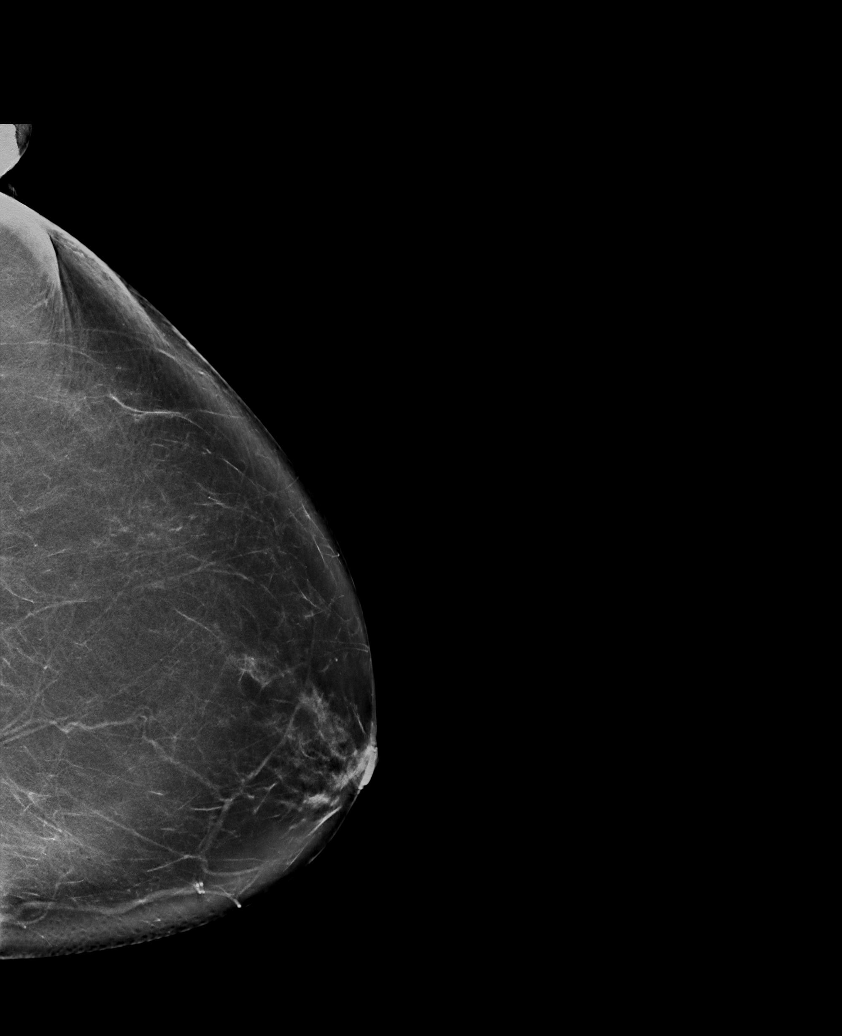

[R MLO synth-2D]
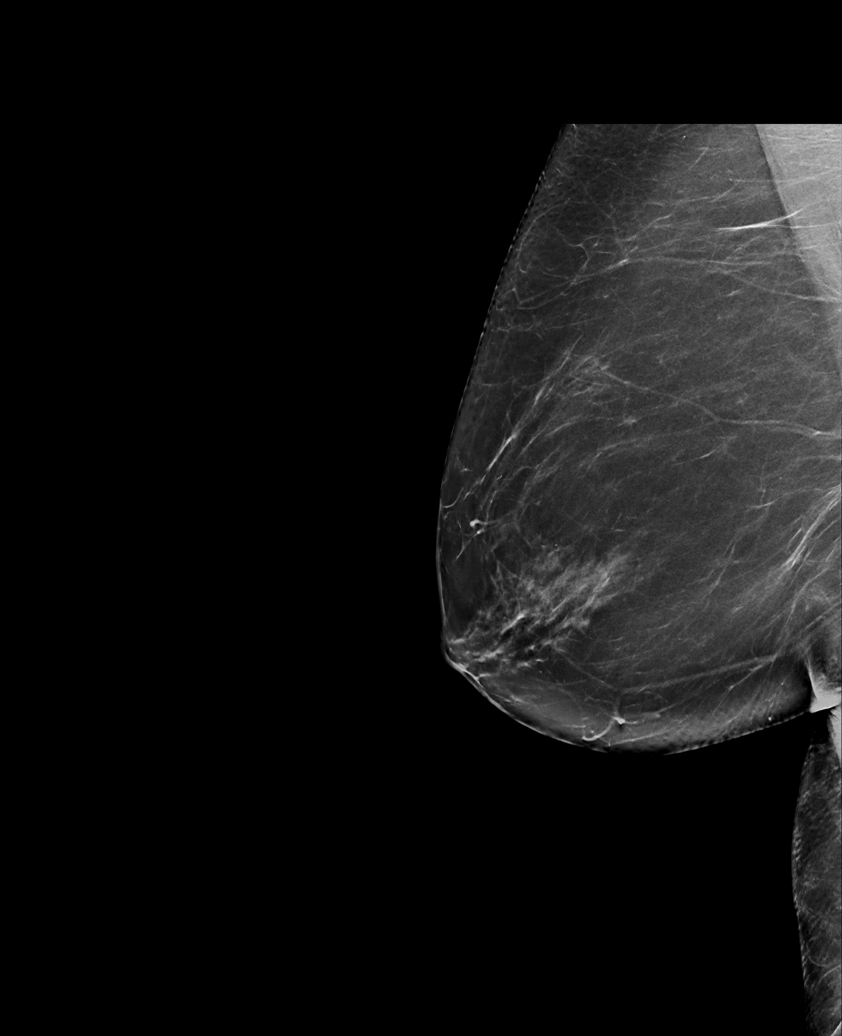

[L CC synth-2D]
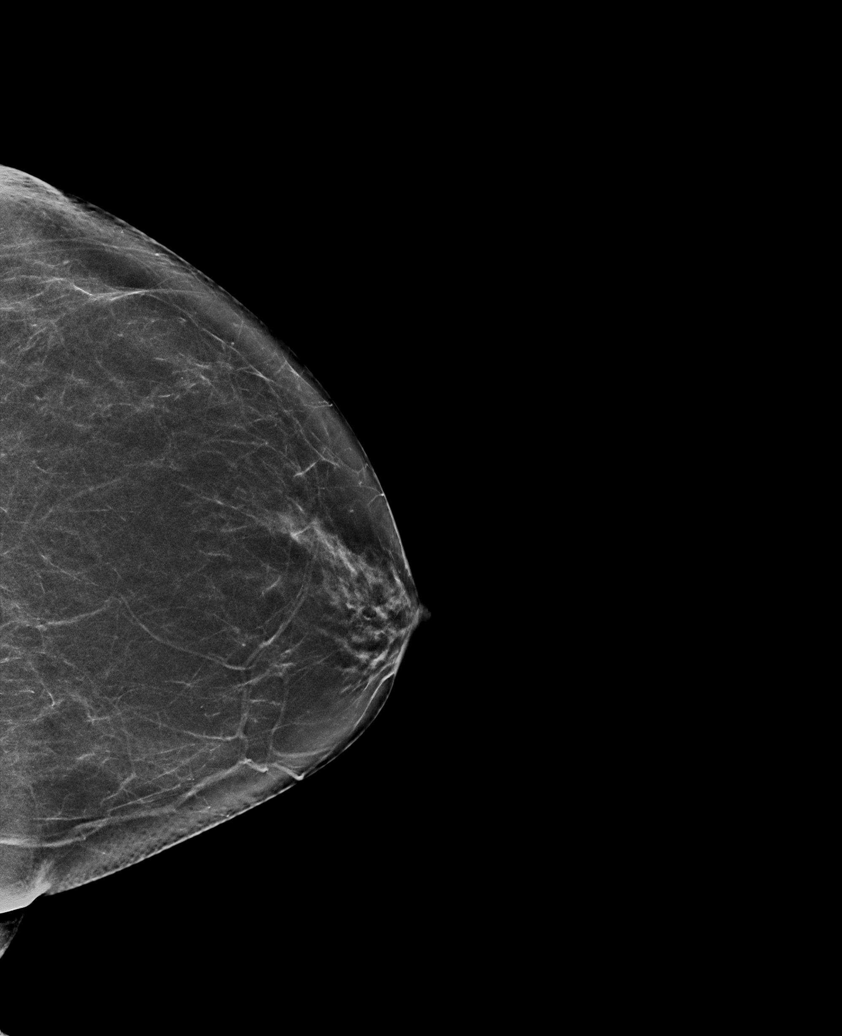

[L MLO synth-2D]
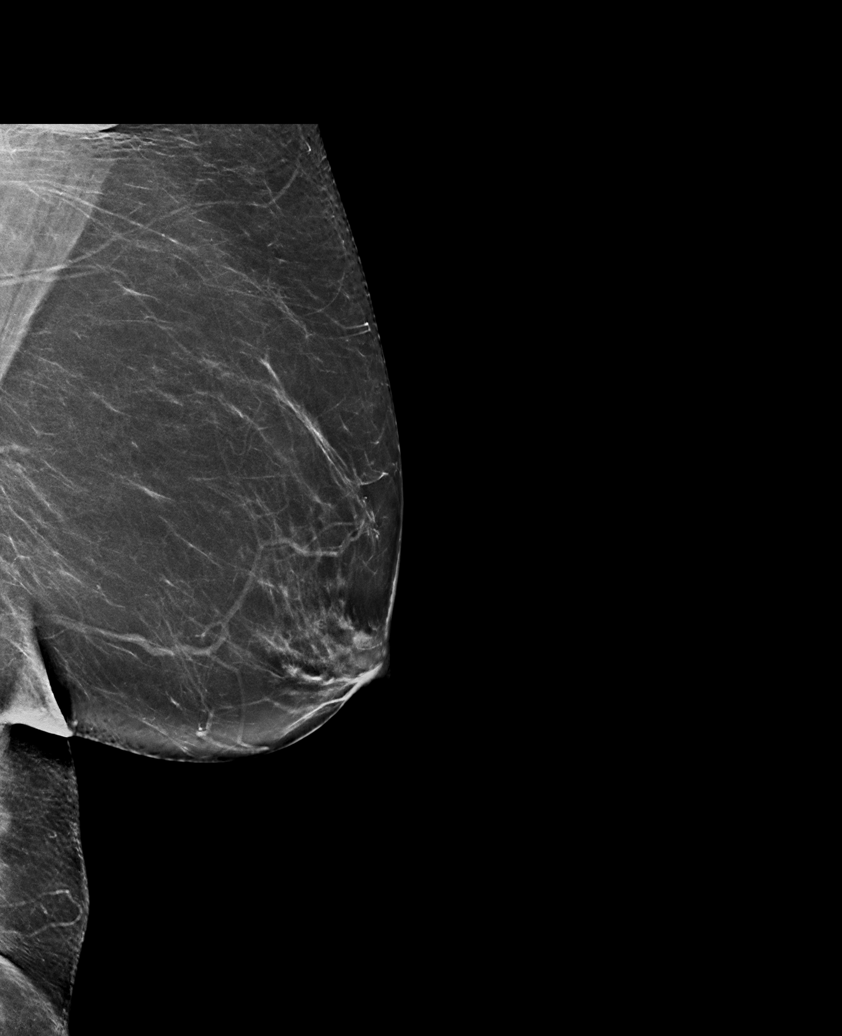

[R CC synth-2D]
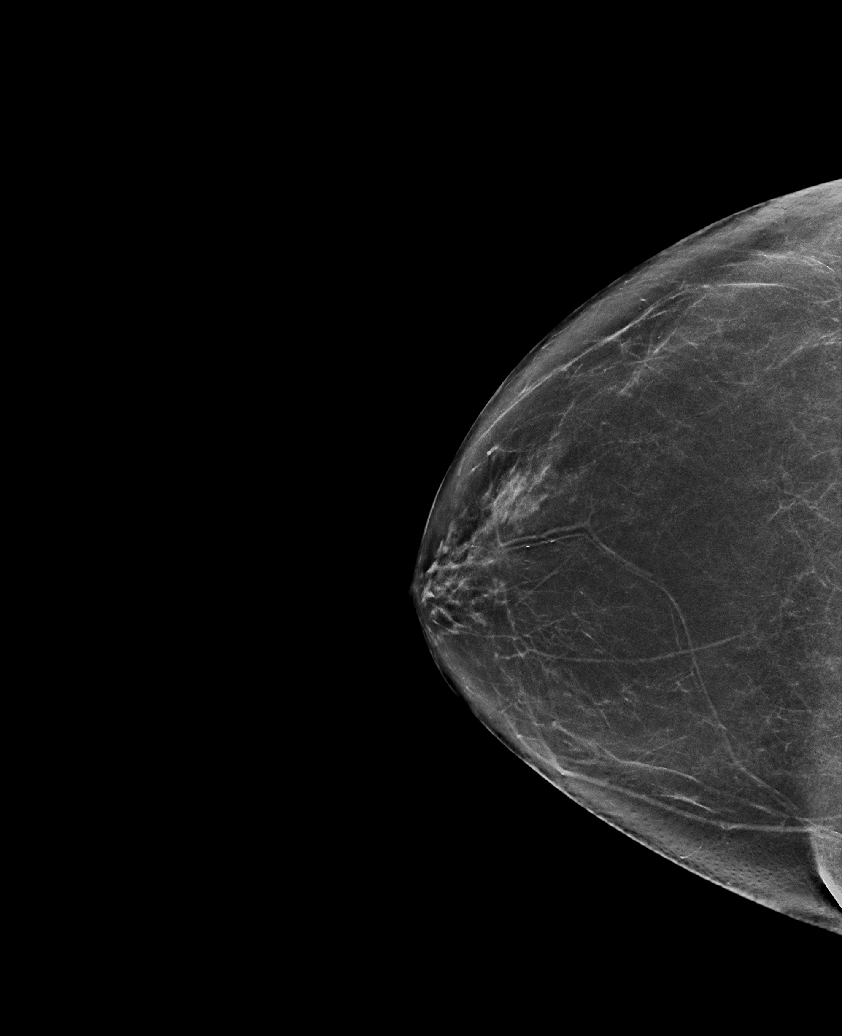

[L CC tomo · tomo slice 33/65.0]
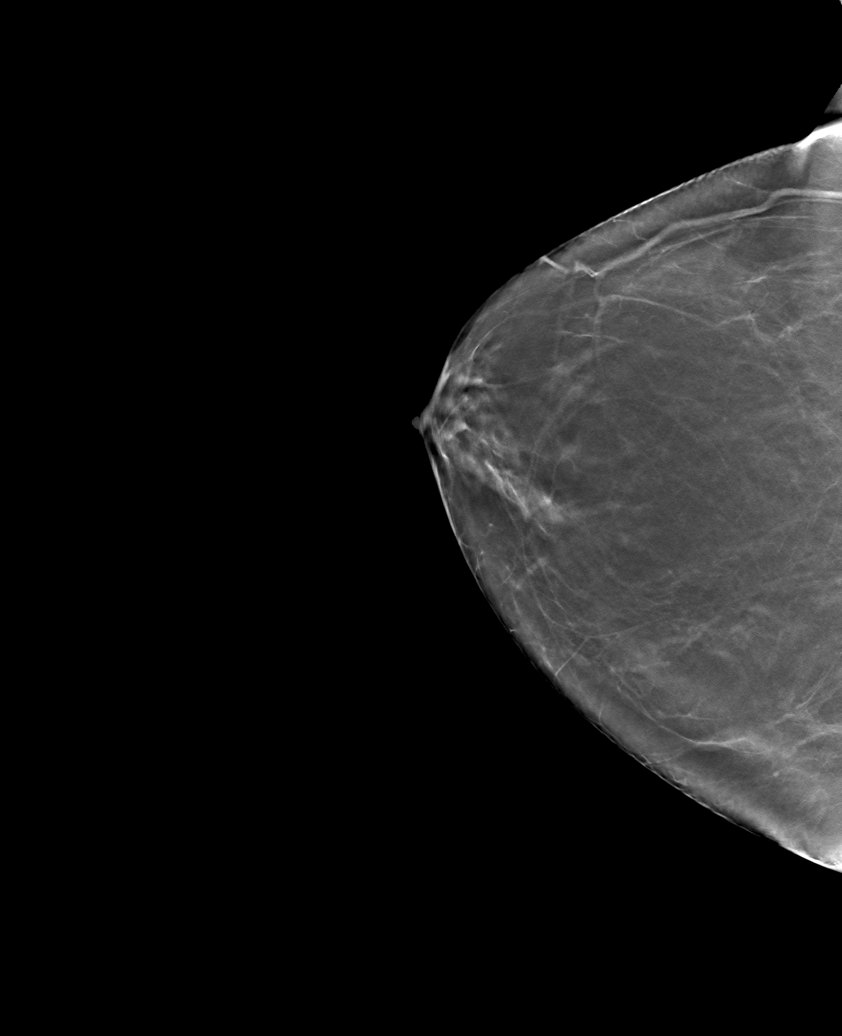

[6 of 30 positions shown; findings below may reference images not displayed]

ACR Breast Density Category b: There are scattered areas of
fibroglandular density.
FINDINGS: There are no findings suspicious for malignancy. Images were
processed with CAD.
IMPRESSION: No mammographic evidence of malignancy. A result letter of this
screening mammogram will be mailed directly to the patient.

RECOMMENDATION:
Screening mammogram in one year. (Code:[id number])

BI-RADS CATEGORY  1: Negative.

## 2020-03-18 MED FILL — CARBIDOPA/LEVO 25/100 TAB: 25-100 | 30 days supply | Qty: 165 | Fill #0

## 2020-03-19 MED FILL — LIDOCAINE HCL 2% JELLY: 2 | 15 days supply | Qty: 30 | Fill #1

## 2020-03-20 MED FILL — DULoxetine HCL 30 MG CPEP: 30 | 14 days supply | Qty: 14 | Fill #0

## 2020-03-20 MED FILL — OXYBUTYNIN CL ER 10 MG TAB: 10 | 30 days supply | Qty: 30 | Fill #0

## 2020-03-26 MED FILL — CEPHALEXIN 500 MG CAPSULE: 500 | 7 days supply | Qty: 21 | Fill #0

## 2020-04-10 ENCOUNTER — Other Ambulatory Visit (HOSPITAL_BASED_OUTPATIENT_CLINIC_OR_DEPARTMENT_OTHER): Payer: Self-pay

## 2020-04-10 MED FILL — DULoxetine HCL 30 MG CPEP: 30 | 30 days supply | Qty: 30 | Fill #0

## 2020-04-22 MED FILL — FLUoxetine HCL 20 MG CAPS: 20 | 90 days supply | Qty: 90 | Fill #0

## 2020-04-22 MED FILL — lamoTRIgine 200 MG TABS: 200 | 90 days supply | Qty: 90 | Fill #0

## 2020-05-06 ENCOUNTER — Inpatient Hospital Stay
Payer: No Typology Code available for payment source | Attending: Hematology & Oncology | Admitting: Hematology & Oncology

## 2020-05-06 ENCOUNTER — Other Ambulatory Visit: Payer: Self-pay

## 2020-05-06 ENCOUNTER — Inpatient Hospital Stay: Payer: No Typology Code available for payment source

## 2020-05-06 ENCOUNTER — Encounter: Payer: Self-pay | Admitting: Hematology & Oncology

## 2020-05-06 VITALS — BP 113/82 | HR 99 | Temp 97.9°F | Resp 18 | Wt 162.0 lb

## 2020-05-06 DIAGNOSIS — Z9221 Personal history of antineoplastic chemotherapy: Secondary | ICD-10-CM | POA: Diagnosis not present

## 2020-05-06 DIAGNOSIS — C189 Malignant neoplasm of colon, unspecified: Secondary | ICD-10-CM

## 2020-05-06 DIAGNOSIS — Z79899 Other long term (current) drug therapy: Secondary | ICD-10-CM | POA: Insufficient documentation

## 2020-05-06 DIAGNOSIS — Z85038 Personal history of other malignant neoplasm of large intestine: Secondary | ICD-10-CM | POA: Insufficient documentation

## 2020-05-06 DIAGNOSIS — G20 Parkinson's disease: Secondary | ICD-10-CM | POA: Diagnosis not present

## 2020-05-06 LAB — CBC WITH DIFFERENTIAL (CANCER CENTER ONLY)
Abs Immature Granulocytes: 0.02 10*3/uL (ref 0.00–0.07)
Basophils Absolute: 0 10*3/uL (ref 0.0–0.1)
Basophils Relative: 0 %
Eosinophils Absolute: 0.1 10*3/uL (ref 0.0–0.5)
Eosinophils Relative: 1 %
HCT: 40.1 % (ref 36.0–46.0)
Hemoglobin: 12.8 g/dL (ref 12.0–15.0)
Immature Granulocytes: 0 %
Lymphocytes Relative: 18 %
Lymphs Abs: 1.2 10*3/uL (ref 0.7–4.0)
MCH: 29.6 pg (ref 26.0–34.0)
MCHC: 31.9 g/dL (ref 30.0–36.0)
MCV: 92.8 fL (ref 80.0–100.0)
Monocytes Absolute: 0.4 10*3/uL (ref 0.1–1.0)
Monocytes Relative: 5 %
Neutro Abs: 5.2 10*3/uL (ref 1.7–7.7)
Neutrophils Relative %: 76 %
Platelet Count: 182 10*3/uL (ref 150–400)
RBC: 4.32 MIL/uL (ref 3.87–5.11)
RDW: 12.6 % (ref 11.5–15.5)
WBC Count: 6.8 10*3/uL (ref 4.0–10.5)
nRBC: 0 % (ref 0.0–0.2)

## 2020-05-06 LAB — CMP (CANCER CENTER ONLY)
ALT: 1 U/L (ref 0–44)
AST: 8 U/L — ABNORMAL LOW (ref 15–41)
Albumin: 4.6 g/dL (ref 3.5–5.0)
Alkaline Phosphatase: 64 U/L (ref 38–126)
Anion gap: 10 (ref 5–15)
BUN: 14 mg/dL (ref 6–20)
CO2: 28 mmol/L (ref 22–32)
Calcium: 9.7 mg/dL (ref 8.9–10.3)
Chloride: 105 mmol/L (ref 98–111)
Creatinine: 0.66 mg/dL (ref 0.44–1.00)
GFR, Estimated: >60 mL/min (ref >60)
Glucose, Bld: 131 mg/dL — ABNORMAL HIGH (ref 70–99)
Potassium: 3.9 mmol/L (ref 3.5–5.1)
Sodium: 143 mmol/L (ref 135–145)
Total Bilirubin: 0.7 mg/dL (ref 0.3–1.2)
Total Protein: 6.9 g/dL (ref 6.5–8.1)

## 2020-05-06 LAB — CEA (IN HOUSE-CHCC): CEA (CHCC-In House): 2.23 ng/mL (ref 0.00–5.00)

## 2020-05-06 NOTE — Progress Notes (Signed)
Hematology and Oncology Follow Up Visit  Lindsay Johnson [id number] 09/30/1960 59 y.o. 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  Unfortunately, her parkinsonism really get worse.  She comes in somewhat unsteady.  She has a cane now.  She is able to drive.  She is not able to work.  I just feel bad that she is really been affected by the Parkinson's.  I think she may have fallen a couple times.  Thankfully, she was able to go up to [place] state for 3 months this summer.  She was goes up there.  She spent 3 months up there.  Her son did get married in March.  Actually, his wedding was at the same place that our nurse, Lindsay Johnson, son got married a couple weeks ago.  Her daughter had a wedding reception in [place] this past weekend.  She has had no problems with bowels or bladder.  She has had no bleeding.  She has had no nausea or vomiting.  She has had no leg swelling.  There has  been no rashes.  Currently, her performance status is ECOG 2-3.     Medications:  Current Outpatient Medications:  .  Aloe Vera 25 MG CAPS, Take 1 capsule by mouth daily., Disp: , Rfl:  .  Carbidopa 25 MG tablet, Take 25 mg by mouth 4 (four) times daily., Disp: , Rfl:  .  carbidopa-levodopa (SINEMET IR) 25-100 MG tablet, Take 1 tablet by mouth 3 (three) times daily., Disp: 270 tablet, Rfl: 0 .  cholecalciferol (VITAMIN D) 1000 units tablet, Take 2,000 Units by mouth daily. 2000 units daily , Disp: , Rfl:  .  denosumab (PROLIA) 60 MG/ML SOSY injection, Inject into the skin., Disp: , Rfl:  .  docusate sodium (COLACE) 100 MG capsule, Take 1 capsule (100 mg total) by mouth 2 (two) times daily as needed for mild constipation. (Patient taking differently: Take 100 mg by mouth daily. ), Disp: 180 capsule, Rfl: 3 .  DULoxetine (CYMBALTA) 60 MG capsule, Take 60 mg by  mouth daily., Disp: , Rfl:  .  FLUoxetine (PROZAC) 20 MG capsule, Take by mouth., Disp: , Rfl:  .  FLUoxetine (PROZAC) 40 MG capsule, Take by mouth., Disp: , Rfl:  .  lamoTRIgine (LAMICTAL) 200 MG tablet, Take 200 mg by mouth daily., Disp: , Rfl:  .  Melatonin 10 MG TABS, Take 1 tablet by mouth daily., Disp: , Rfl:  .  Methylcellulose, Laxative, (CITRUCEL PO), Take 1 tablet by mouth 2 (two) times daily., Disp: , Rfl:  .  oxybutynin (DITROPAN-XL) 10 MG 24 hr tablet, Take 10 mg by mouth daily., Disp: , Rfl:  .  pentosan polysulfate (ELMIRON) 100 MG capsule, Take 100 mg by mouth daily., Disp: , Rfl:  .  pravastatin (PRAVACHOL) 40 MG tablet, Take 40 mg by mouth daily., Disp: , Rfl:  .  vitamin B-12 (CYANOCOBALAMIN) 1000 MCG tablet, Take 1,000 mcg by mouth daily., Disp: , Rfl:  .  Abaloparatide (TYMLOS) 3120 MCG/1.56ML SOPN, Inject into the skin., Disp: , Rfl:  .  betamethasone dipropionate (DIPROLENE) 0.05 % ointment, Apply 1 application topically 2 (two) times daily., Disp: , Rfl:  .  Cetirizine HCl (ZYRTEC PO), Take 1 tablet by mouth as needed. For seasonal allergies, Disp: , Rfl:  .  DULoxetine (CYMBALTA) 30 MG capsule,  Take 30 mg by mouth daily. Take two capsules, total of 60 mg by mouth every morning., Disp: , Rfl:  .  famotidine (PEPCID) 20 MG tablet, Take 1 tablet (20 mg total) by mouth 2 (two) times daily for 7 days., Disp: 14 tablet, Rfl: 0 .  Insulin Pen Needle (MM PEN NEEDLES) 31G X 5 MM MISC, Use 1 needle once daily with Tymlos., Disp: , Rfl:  .  lidocaine (XYLOCAINE) 2 % jelly, Apply topically daily as needed. (Patient not taking: Reported on 05/06/2020), Disp: , Rfl:  .  pravastatin (PRAVACHOL) 20 MG tablet, Take 20 mg by mouth daily., Disp: , Rfl:  .  TYMLOS 3120 MCG/1.56ML SOPN, , Disp: , Rfl:   Allergies:  Allergies  Allergen Reactions  . Codeine     Makes Pt very "hyper"  . Sulfa Antibiotics     rash  . Wellbutrin [Bupropion] Other (See Comments)    Past Medical History,  Surgical history, Social history, and Family History were reviewed and updated.  Review of System:   Review of Systems  Constitutional: Positive for malaise/fatigue.  HENT: Negative.   Eyes: Negative.   Respiratory: Negative.  Negative for cough.   Cardiovascular: Negative.   Gastrointestinal: Positive for heartburn and nausea.  Genitourinary: Negative.   Musculoskeletal: Positive for joint pain and myalgias.  Skin: Negative.   Neurological: Positive for tremors.  Endo/Heme/Allergies: Negative.   Psychiatric/Behavioral: Negative.      Physical Exam:  weight is 162 lb (73.5 kg). Her oral temperature is 97.9 F (36.6 C). Her blood pressure is 113/82 and her pulse is 99. Her respiration is 18 and oxygen saturation is 99%.   Wt Readings from Last 3 Encounters:  05/06/20 162 lb (73.5 kg)  07/16/19 170 lb (77.1 kg)  07/02/19 170 lb (77.1 kg)     Physical Exam Vitals reviewed.  HENT:     Head: Normocephalic and atraumatic.  Eyes:     Pupils: Pupils are equal, round, and reactive to light.  Cardiovascular:     Rate and Rhythm: Normal rate and regular rhythm.     Heart sounds: Normal heart sounds.  Pulmonary:     Effort: Pulmonary effort is normal.     Breath sounds: Normal breath sounds.  Abdominal:     General: Bowel sounds are normal.     Palpations: Abdomen is soft.  Musculoskeletal:        General: No tenderness or deformity. Normal range of motion.     Cervical back: Normal range of motion.  Lymphadenopathy:     Cervical: No cervical adenopathy.  Skin:    General: Skin is warm and dry.     Findings: No erythema or rash.  Neurological:     Mental Status: She is alert and oriented to person, place, and time.  Psychiatric:        Behavior: Behavior normal.        Thought Content: Thought content normal.        Judgment: Judgment normal.      Lab Results  Component Value Date   WBC 6.8 05/06/2020   HGB 12.8 05/06/2020   HCT 40.1 05/06/2020   MCV 92.8  05/06/2020   PLT 182 05/06/2020     Chemistry      Component Value Date/Time   NA 143 05/06/2020 1009   NA 143 05/09/2017 1343   NA 140 05/13/2016 1256   K 3.9 05/06/2020 1009   K 3.7 05/09/2017 1343   K 3.7 05/13/2016  1256   CL 105 05/06/2020 1009   CL 104 05/09/2017 1343   CO2 28 05/06/2020 1009   CO2 28 05/09/2017 1343   CO2 22 05/13/2016 1256   BUN 14 05/06/2020 1009   BUN 17 05/09/2017 1343   BUN 17.2 05/13/2016 1256   CREATININE 0.66 05/06/2020 1009   CREATININE 0.9 05/09/2017 1343   CREATININE 0.7 05/13/2016 1256      Component Value Date/Time   CALCIUM 9.7 05/06/2020 1009   CALCIUM 9.7 05/09/2017 1343   CALCIUM 9.2 05/13/2016 1256   ALKPHOS 64 05/06/2020 1009   ALKPHOS 64 05/09/2017 1343   ALKPHOS 81 05/13/2016 1256   AST 8 (L) 05/06/2020 1009   AST 16 05/13/2016 1256   ALT 1 05/06/2020 1009   ALT 24 05/09/2017 1343   ALT 20 05/13/2016 1256   BILITOT 0.7 05/06/2020 1009   BILITOT 0.54 05/13/2016 1256      Impression and Plan: Ms. Langenberg is a 59 year old white female. She had stage II invasive colon cancer 15 years ago. She underwent resection. She underwent adjuvant chemotherapy with FOLFOX.  I feel bad that she does have the Parkinson's.  Clearly, this is going to be the the determinant of her prognosis.  At this point, I do still think we have to get her back to see [place].  She is having more difficulties with the Parkinson's.  I just do not want to make it difficult for her to see [place].  I do still see that cancer will be a problem for her.  We will clearly pray hard that the Parkinson's at least stabilizes.      [name], MD 11/2/202111:38 AM

## 2020-05-07 MED FILL — DULoxetine HCL 30 MG CPEP: 30 | 30 days supply | Qty: 30 | Fill #0

## 2020-05-13 ENCOUNTER — Other Ambulatory Visit (HOSPITAL_BASED_OUTPATIENT_CLINIC_OR_DEPARTMENT_OTHER): Payer: Self-pay | Admitting: Internal Medicine

## 2020-05-13 ENCOUNTER — Ambulatory Visit: Payer: No Typology Code available for payment source | Attending: Internal Medicine

## 2020-05-13 DIAGNOSIS — Z23 Encounter for immunization: Secondary | ICD-10-CM

## 2020-05-13 MED FILL — DULoxetine HCL 30 MG CPEP: 30 | 28 days supply | Qty: 84 | Fill #0

## 2020-05-13 MED FILL — FLUoxetine HCL 40 MG CAPS: 40 | 28 days supply | Qty: 30 | Fill #0

## 2020-05-13 MED FILL — lamoTRIgine 200 MG TABS: 200 | 90 days supply | Qty: 90 | Fill #0

## 2020-05-16 MED FILL — JANSSEN COVID-19 VACCINE 0.: 0.5 | 1 days supply | Qty: 1 | Fill #0

## 2020-07-22 ENCOUNTER — Other Ambulatory Visit (HOSPITAL_BASED_OUTPATIENT_CLINIC_OR_DEPARTMENT_OTHER): Payer: Self-pay | Admitting: Nurse Practitioner

## 2020-07-22 DIAGNOSIS — Z1231 Encounter for screening mammogram for malignant neoplasm of breast: Secondary | ICD-10-CM

## 2020-07-25 ENCOUNTER — Other Ambulatory Visit (HOSPITAL_BASED_OUTPATIENT_CLINIC_OR_DEPARTMENT_OTHER): Payer: Self-pay

## 2020-07-25 MED FILL — LAMOTRIGINE 25 MG TABS: 25 | 30 days supply | Qty: 60 | Fill #0

## 2020-08-07 ENCOUNTER — Other Ambulatory Visit (HOSPITAL_BASED_OUTPATIENT_CLINIC_OR_DEPARTMENT_OTHER): Payer: Self-pay

## 2020-08-07 MED FILL — CEPHALEXIN 500 MG CAPSULE: 500 | 5 days supply | Qty: 10 | Fill #0

## 2020-08-21 ENCOUNTER — Other Ambulatory Visit (HOSPITAL_BASED_OUTPATIENT_CLINIC_OR_DEPARTMENT_OTHER): Payer: Self-pay

## 2020-08-21 MED FILL — HYDROCODON-APAP 5-325: 5-325 | 5 days supply | Qty: 10 | Fill #0

## 2020-08-21 MED FILL — CEPHALEXIN 500 MG CAPSULE: 500 | 7 days supply | Qty: 28 | Fill #0

## 2020-09-02 ENCOUNTER — Encounter (HOSPITAL_BASED_OUTPATIENT_CLINIC_OR_DEPARTMENT_OTHER): Payer: Self-pay

## 2020-09-02 ENCOUNTER — Other Ambulatory Visit: Payer: Self-pay

## 2020-09-02 ENCOUNTER — Ambulatory Visit (HOSPITAL_BASED_OUTPATIENT_CLINIC_OR_DEPARTMENT_OTHER)
Admission: RE | Admit: 2020-09-02 | Discharge: 2020-09-02 | Disposition: A | Discharge status: Home / Self Care | Payer: No Typology Code available for payment source | Source: Ambulatory Visit | Attending: Nurse Practitioner | Admitting: Nurse Practitioner

## 2020-09-02 DIAGNOSIS — Z1231 Encounter for screening mammogram for malignant neoplasm of breast: Secondary | ICD-10-CM | POA: Diagnosis present

## 2020-09-05 ENCOUNTER — Other Ambulatory Visit (HOSPITAL_BASED_OUTPATIENT_CLINIC_OR_DEPARTMENT_OTHER): Payer: Self-pay

## 2020-09-05 MED FILL — LAMOTRIGINE 100 MG TABS: 100 | 30 days supply | Qty: 30 | Fill #0

## 2020-09-16 ENCOUNTER — Other Ambulatory Visit (HOSPITAL_BASED_OUTPATIENT_CLINIC_OR_DEPARTMENT_OTHER): Payer: Self-pay

## 2020-10-02 ENCOUNTER — Other Ambulatory Visit (HOSPITAL_BASED_OUTPATIENT_CLINIC_OR_DEPARTMENT_OTHER): Payer: Self-pay

## 2020-10-02 MED FILL — VENLAFAXINE HCL 37.5 MG TAB: 37.5 | 30 days supply | Qty: 74 | Fill #0

## 2020-10-14 ENCOUNTER — Other Ambulatory Visit (HOSPITAL_BASED_OUTPATIENT_CLINIC_OR_DEPARTMENT_OTHER): Payer: Self-pay

## 2020-10-14 MED ORDER — PRAMIPEXOLE DIHYDROCHLORIDE 0.25 MG PO TABS
ORAL_TABLET | ORAL | 1 refills | Status: DC
  Filled 2020-10-14: qty 45, 30d supply, fill #0

## 2020-10-23 ENCOUNTER — Other Ambulatory Visit (HOSPITAL_BASED_OUTPATIENT_CLINIC_OR_DEPARTMENT_OTHER): Payer: Self-pay

## 2020-10-23 MED ORDER — SCOPOLAMINE 1 MG/3DAYS TD PT72
MEDICATED_PATCH | TRANSDERMAL | 0 refills | Status: DC
  Filled 2020-10-23: qty 4, 12d supply, fill #0

## 2020-11-04 ENCOUNTER — Other Ambulatory Visit (HOSPITAL_BASED_OUTPATIENT_CLINIC_OR_DEPARTMENT_OTHER): Payer: Self-pay

## 2020-11-04 MED ORDER — CARBIDOPA-LEVODOPA 25-100 MG PO TABS
ORAL_TABLET | ORAL | 1 refills | Status: AC
  Filled 2020-11-04 – 2020-11-07 (×2): qty 900, 90d supply, fill #0

## 2020-11-05 ENCOUNTER — Other Ambulatory Visit (HOSPITAL_BASED_OUTPATIENT_CLINIC_OR_DEPARTMENT_OTHER): Payer: Self-pay

## 2020-11-06 ENCOUNTER — Other Ambulatory Visit (HOSPITAL_BASED_OUTPATIENT_CLINIC_OR_DEPARTMENT_OTHER): Payer: Self-pay

## 2020-11-07 ENCOUNTER — Other Ambulatory Visit (HOSPITAL_BASED_OUTPATIENT_CLINIC_OR_DEPARTMENT_OTHER): Payer: Self-pay

## 2020-12-05 ENCOUNTER — Other Ambulatory Visit (HOSPITAL_BASED_OUTPATIENT_CLINIC_OR_DEPARTMENT_OTHER): Payer: Self-pay

## 2020-12-05 MED ORDER — DONEPEZIL HCL 5 MG PO TABS
ORAL_TABLET | ORAL | 3 refills | Status: AC
  Filled 2020-12-05: qty 90, 90d supply, fill #0
  Filled 2021-02-16: qty 90, 90d supply, fill #1
  Filled 2021-05-14: qty 90, 90d supply, fill #2
  Filled 2021-09-06: qty 90, 90d supply, fill #3

## 2020-12-19 ENCOUNTER — Ambulatory Visit: Payer: No Typology Code available for payment source | Attending: Internal Medicine

## 2020-12-19 DIAGNOSIS — Z23 Encounter for immunization: Secondary | ICD-10-CM

## 2020-12-19 NOTE — Progress Notes (Signed)
   Covid-19 Vaccination Clinic  Name:  Lindsay Johnson    MRN: [id number] DOB: 1960-11-04  12/19/2020  [name] was observed post Covid-19 immunization for 15 minutes without incident. She was provided with Vaccine Information Sheet and instruction to access the V-Safe system.   [name] was instructed to call 911 with any severe reactions post vaccine: Difficulty breathing  Swelling of face and throat  A fast heartbeat  A bad rash all over body  Dizziness and weakness   Immunizations Administered     Name Date Dose VIS Date Route   Moderna Covid-19 Booster Vaccine 12/19/2020 12:46 PM 0.25 mL 04/23/2020 Intramuscular   Manufacturer: Moderna   Lot: [id number]   [place]: [id number]

## 2020-12-22 ENCOUNTER — Other Ambulatory Visit (HOSPITAL_BASED_OUTPATIENT_CLINIC_OR_DEPARTMENT_OTHER): Payer: Self-pay

## 2020-12-22 MED ORDER — COVID-19 MRNA VACC (MODERNA) 100 MCG/0.5ML IM SUSP
INTRAMUSCULAR | 0 refills | Status: DC
  Filled 2020-12-22: qty 0.25, 1d supply, fill #0

## 2021-01-22 ENCOUNTER — Other Ambulatory Visit (HOSPITAL_BASED_OUTPATIENT_CLINIC_OR_DEPARTMENT_OTHER): Payer: Self-pay

## 2021-01-22 MED ORDER — GLYCOPYRROLATE 1 MG PO TABS
ORAL_TABLET | ORAL | 2 refills | Status: DC
  Filled 2021-01-22: qty 60, 30d supply, fill #0

## 2021-01-22 MED ORDER — GLYCOPYRROLATE 2 MG PO TABS
ORAL_TABLET | ORAL | 2 refills | Status: DC
  Filled 2021-01-22: qty 60, 30d supply, fill #0

## 2021-01-23 ENCOUNTER — Other Ambulatory Visit (HOSPITAL_BASED_OUTPATIENT_CLINIC_OR_DEPARTMENT_OTHER): Payer: Self-pay

## 2021-01-26 ENCOUNTER — Other Ambulatory Visit (HOSPITAL_BASED_OUTPATIENT_CLINIC_OR_DEPARTMENT_OTHER): Payer: Self-pay

## 2021-01-27 ENCOUNTER — Other Ambulatory Visit (HOSPITAL_BASED_OUTPATIENT_CLINIC_OR_DEPARTMENT_OTHER): Payer: Self-pay

## 2021-02-03 ENCOUNTER — Other Ambulatory Visit (HOSPITAL_BASED_OUTPATIENT_CLINIC_OR_DEPARTMENT_OTHER): Payer: Self-pay

## 2021-02-03 MED ORDER — MIRTAZAPINE 15 MG PO TABS
ORAL_TABLET | ORAL | 0 refills | Status: DC
  Filled 2021-02-03: qty 60, 60d supply, fill #0

## 2021-02-12 ENCOUNTER — Other Ambulatory Visit (HOSPITAL_BASED_OUTPATIENT_CLINIC_OR_DEPARTMENT_OTHER): Payer: Self-pay

## 2021-02-12 MED ORDER — GEMTESA 75 MG PO TABS
ORAL_TABLET | ORAL | 2 refills | Status: DC
  Filled 2021-02-12 – 2021-02-23 (×4): qty 30, 30d supply, fill #0

## 2021-02-13 ENCOUNTER — Other Ambulatory Visit (HOSPITAL_BASED_OUTPATIENT_CLINIC_OR_DEPARTMENT_OTHER): Payer: Self-pay

## 2021-02-16 ENCOUNTER — Other Ambulatory Visit (HOSPITAL_BASED_OUTPATIENT_CLINIC_OR_DEPARTMENT_OTHER): Payer: Self-pay

## 2021-02-16 MED ORDER — NITROFURANTOIN MONOHYD MACRO 100 MG PO CAPS
ORAL_CAPSULE | ORAL | 0 refills | Status: DC
  Filled 2021-02-16: qty 14, 7d supply, fill #0

## 2021-02-17 ENCOUNTER — Other Ambulatory Visit (HOSPITAL_BASED_OUTPATIENT_CLINIC_OR_DEPARTMENT_OTHER): Payer: Self-pay

## 2021-02-18 ENCOUNTER — Other Ambulatory Visit (HOSPITAL_BASED_OUTPATIENT_CLINIC_OR_DEPARTMENT_OTHER): Payer: Self-pay

## 2021-02-20 ENCOUNTER — Other Ambulatory Visit (HOSPITAL_BASED_OUTPATIENT_CLINIC_OR_DEPARTMENT_OTHER): Payer: Self-pay

## 2021-02-23 ENCOUNTER — Other Ambulatory Visit (HOSPITAL_BASED_OUTPATIENT_CLINIC_OR_DEPARTMENT_OTHER): Payer: Self-pay

## 2021-02-23 MED ORDER — TRIHEXYPHENIDYL HCL 2 MG PO TABS
ORAL_TABLET | ORAL | 11 refills | Status: AC
  Filled 2021-02-23: qty 45, 30d supply, fill #0

## 2021-02-24 ENCOUNTER — Other Ambulatory Visit (HOSPITAL_BASED_OUTPATIENT_CLINIC_OR_DEPARTMENT_OTHER): Payer: Self-pay

## 2021-02-26 ENCOUNTER — Other Ambulatory Visit (HOSPITAL_BASED_OUTPATIENT_CLINIC_OR_DEPARTMENT_OTHER): Payer: Self-pay

## 2021-03-10 ENCOUNTER — Telehealth (INDEPENDENT_AMBULATORY_CARE_PROVIDER_SITE_OTHER): Payer: No Typology Code available for payment source | Admitting: Gastroenterology

## 2021-03-10 ENCOUNTER — Other Ambulatory Visit (HOSPITAL_BASED_OUTPATIENT_CLINIC_OR_DEPARTMENT_OTHER): Payer: Self-pay

## 2021-03-10 ENCOUNTER — Encounter: Payer: Self-pay | Admitting: Gastroenterology

## 2021-03-10 VITALS — Ht 64.0 in | Wt 149.0 lb

## 2021-03-10 DIAGNOSIS — G20 Parkinson's disease: Secondary | ICD-10-CM | POA: Diagnosis not present

## 2021-03-10 DIAGNOSIS — R432 Parageusia: Secondary | ICD-10-CM | POA: Diagnosis not present

## 2021-03-10 DIAGNOSIS — K5900 Constipation, unspecified: Secondary | ICD-10-CM | POA: Diagnosis not present

## 2021-03-10 DIAGNOSIS — Z85038 Personal history of other malignant neoplasm of large intestine: Secondary | ICD-10-CM

## 2021-03-10 MED ORDER — FLUTICASONE PROPIONATE 50 MCG/ACT NA SUSP
2.0000 | Freq: Every day | NASAL | 2 refills | Status: AC
  Filled 2021-03-10: qty 16, 30d supply, fill #0

## 2021-03-10 NOTE — Patient Instructions (Addendum)
If you are age 58 or older, your body mass index should be between 23-30. Your Body mass index is 25.58 kg/m. If this is out of the aforementioned range listed, please consider follow up with your Primary Care Provider.  If you are age 85 or younger, your body mass index should be between 19-25. Your Body mass index is 25.58 kg/m. If this is out of the aformentioned range listed, please consider follow up with your Primary Care Provider.   __________________________________________________________  The [facility] providers would like to encourage you to use [facility] to communicate with providers for non-urgent requests or questions.  Due to long hold times on the telephone, sending your provider a message by [facility] may be a faster and more efficient way to get a response.  Please allow 48 business hours for a response.  Please remember that this is for non-urgent requests.   Please go to the lab on the 2nd floor [address]. They are open Monday-Friday 8am-4pm and close from 12pm-1pm for lunch  We have sent the following medications to your pharmacy for you to pick up at your convenience: Flonase  Please call with any questions or concerns.  It was a pleasure to see you today!  Vito Cirigliano, D.O.

## 2021-03-10 NOTE — Progress Notes (Signed)
**Note Lindsay-Identified via Obfuscation** Chief Complaint: Decreased appetite   Referring Provider:     Rikki Spearing, NP  GI History: 60 year old female with a history of colon cancer (stage II Adenocarcinoma of the sigmoid colon diagnosed 06/2003, left hemicolectomy and chemotherapy 2005) and Parkinson's diagnosed 2019, initially seen by me in 05/2018 for lower abdominal pain and constipation.   Constipation previously treated with enemas, MiraLAX, prune juice.  Seen in the [facility] 05/2018 with normal labs and CT, prescribed Bentyl, Valium, Reglan, MiraLAX.  Constipation attributed to combination of Parkinson's Disease along with Parkinson's medications.  Had been well controlled with Colace and Citrucel 1-2 times daily with rare use of MiraLAX.  Colonoscopy with small tubular adenomas and a patent, healthy-appearing anastomosis, otherwise normal.  Constipation exacerbated by addition of Zofran for nausea in 10/2018.   Nausea also suspected secondary to Parkinson's medication (started after initiating Sinemet in 01/2018).  EGD otherwise largely unrevealing.  No improvement with Reglan.  Had been controlling with PPI, then exacerbation after starting Lamictal in 07/2018.  Due to ongoing nausea, was prescribed Zofran in 10/2018, which unfortunately exacerbated her constipation as above.   Extensive evaluation to date notable for normal 5 HIAA, urine porphobilinogen.  Normal CBC, CMP, lipase.  Normal CEA, B12.  Negative H. pylori.  EGD/colonoscopy completed 05/2018 as below.   Hx of reflux that is generally well controlled with Pepcid (and previously omeprazole).  Intermittent abdominal pain improved with intermittent use of peppermint Altoids prn (FD guard worked but was cost prohibitive).  EGD essentially normal 05/2017.   Endoscopic history: -Colonoscopy 03/2012: No report available -Colonoscopy 03/2013: No report available -Colonoscopy 08/2015: No report available - EGD (05/2018, Dr. Bryan Lemma): Normal esophagus, stomach,  duodenum.  Biopsies with non-H. pylori gastritis, normal duodenum. -Colonoscopy (05/2018, Dr. Bryan Lemma): 8 mm flat cecal TA, 8 mm splenic flexure TA, 2 small (3 to 4 mm) TA  in sigmoid and descending colon.  Patent, healthy-appearing colocolonic anastomosis in the rectosigmoid.  Normal TI.  Recommended repeat in 1 year. - Colonoscopy (07/2019, Dr. Bryan Lemma): 3 mm tubular adenoma, healthy anastomosis, small internal hemorrhoids.  Repeat in 3 years     HPI:    Due to current restrictions/limitations of in-office visits due to the COVID-19 pandemic, this scheduled clinical appointment was converted to a telehealth virtual consultation using MyChart video  -Time of medical discussion: 25 minutes -The patient did consent to this virtual visit and is aware of possible charges through their insurance for this visit.  -Names of all parties present: Lindsay Johnson (patient), Gerrit Heck, DO, [facility] (physician) -Patient location: Home -Physician location: Office  Lindsay Johnson is a 60 y.o. female presenting to the Gastroenterology Clinic for follow-up.  Was last seen by me on 03/20/2019 for evaluation of nausea/vomiting presumed to be medication ADR, along with constipation also thought 2/2 medication ADR vs autonomic dysfunction 2/2 Parkinson's.  Controlled with Colace and Citrucel with very rare use of MiraLAX.  Subsequently completed EGD and colonoscopy x2 as outlined above.  Today, she states main issue is dysguesia. Has been present x1 year, but worse lately.  Craving sweets as this taste seems to be preserved.  Subsequent decrease in appetite, but this is 2/2 her altered taste.  Otherwise no early satiety.  No nausea/vomiting.  No active dental issues.  Denies reflux symptoms.  Recently started on Aricept, but altered taste symptoms predate this medication.  Similarly, had COVID in 07/2020, but after  the above symptoms of already started.  Constipation ok on current Rx  Has been off Pepcid for  a year or so. Has previously followed with ENT at [facility].    Reviewed labs from 02/19/2021: Normal CBC, CMP, TSH, CEA  Recently transferred her Neurology care to Dr. Hyman Bower at [facility], seen on 02/23/2021.  Has had recent falls, using her cane then switched to a walker but still with instability.  No longer able to drive and difficulty even getting in/out of the car, particular with her walker.  Elevated concerns for progression of her Parkinson's despite continued carbidopa levodopa 4 times daily.  Does have hypersalivation; added Artane last month.  Difficulty communicating due to voice becoming softer recently.    Past medical history, past surgical history, social history, family history, medications, and allergies reviewed in the chart and with patient.    Past Medical History:  Diagnosis Date   Allergy    Anxiety    Colon cancer ([place]) 2004   Depression    per pt   GERD (gastroesophageal reflux disease)    History of chemotherapy    ended 2005   Hyperlipemia    Interstitial cystitis    Neuromuscular disorder ([place])    Per pt Parkinson Disease   Parkinson disease ([place])    Tremor      Past Surgical History:  Procedure Laterality Date   COLECTOMY  2005   COLONOSCOPY  2017   INNER EAR SURGERY     multiple ear surgeries per patient   POLYPECTOMY     UPPER GASTROINTESTINAL ENDOSCOPY     Family History  Problem Relation Age of Onset   Other Mother        ETOH   Heart disease Father    Hyperlipidemia Father    Hypertension Sister    Hypertension Brother    Colon cancer Maternal Uncle    Rectal cancer Maternal Uncle    Colon polyps Maternal Uncle    Colon polyps Sister    Colon polyps Maternal Grandmother    Esophageal cancer Neg Hx    Stomach cancer Neg Hx    Social History   Tobacco Use   Smoking status: Never   Smokeless tobacco: Never  Vaping Use   Vaping Use: Never used  Substance Use Topics   Alcohol use: Yes    Comment: occ   Drug use: No   Current  Outpatient Medications  Medication Sig Dispense Refill   Abaloparatide (TYMLOS) 3120 MCG/1.56ML SOPN Inject into the skin.     Aloe Vera 25 MG CAPS Take 1 capsule by mouth daily.     betamethasone dipropionate (DIPROLENE) 0.05 % ointment Apply 1 application topically 2 (two) times daily.     Carbidopa 25 MG tablet Take 25 mg by mouth 4 (four) times daily.     carbidopa-levodopa (SINEMET IR) 25-100 MG tablet Take 1 tablet by mouth 3 (three) times daily. 270 tablet 0   carbidopa-levodopa (SINEMET IR) 25-100 MG tablet Take 2&1/2 tablets by mouth 4 times daily. 900 tablet 1   cephALEXin (KEFLEX) 500 MG capsule TAKE 1 CAPSULE (500 MG TOTAL) BY MOUTH 4 TIMES DAILY FOR 7 DAYS. 28 capsule 0   cephALEXin (KEFLEX) 500 MG capsule TAKE 1 CAPSULE (500 MG TOTAL) BY MOUTH 2 TIMES DAILY FOR 5 DAYS. 10 capsule 0   Cetirizine HCl (ZYRTEC PO) Take 1 tablet by mouth as needed. For seasonal allergies     cholecalciferol (VITAMIN D) 1000 units tablet Take 2,000 Units  by mouth daily. 2000 units daily      COVID-19 Ad26 vaccine, JANSSEN/J&J, 0.5 ML injection INJECT AS DIRECTED .5 mL 0   COVID-19 mRNA vaccine, Moderna, 100 MCG/0.5ML injection Inject into the muscle. 0.25 mL 0   denosumab (PROLIA) 60 MG/ML SOSY injection Inject into the skin.     docusate sodium (COLACE) 100 MG capsule Take 1 capsule (100 mg total) by mouth 2 (two) times daily as needed for mild constipation. (Patient taking differently: Take 100 mg by mouth daily. ) 180 capsule 3   donepezil (ARICEPT) 5 MG tablet Take 1 tablet (5 mg total) by mouth nightly. 90 tablet 3   DULoxetine (CYMBALTA) 30 MG capsule Take 30 mg by mouth daily. Take two capsules, total of 60 mg by mouth every morning.     DULoxetine (CYMBALTA) 30 MG capsule TAKE 1 CAPSULE BY MOUTH ONCE DAILY FOR 14 DAYS, THEN TAKE 1 CAPSULE 2 TIMES DAILY FOR 14 DAYS, THEN 2 CAPSULES 2 TIMES DAILY FOR 14 DAYS 150 capsule 0   DULoxetine (CYMBALTA) 30 MG capsule TAKE 1 CAPSULE BY MOUTH TWO TIMES DAILY  FOR 14 DAYS THEN TAKE 2 CAPUSLES TWO TIMES DAILY FOR 14 DAYS 84 capsule 0   DULoxetine (CYMBALTA) 30 MG capsule TAKE 1 CAPSULE (30 MG TOTAL) BY MOUTH DAILY. 30 capsule 1   DULoxetine (CYMBALTA) 60 MG capsule Take 60 mg by mouth daily.     famotidine (PEPCID) 20 MG tablet Take 1 tablet (20 mg total) by mouth 2 (two) times daily for 7 days. 14 tablet 0   FLUoxetine (PROZAC) 20 MG capsule Take by mouth.     FLUoxetine (PROZAC) 40 MG capsule Take by mouth.     FLUoxetine (PROZAC) 40 MG capsule ON WEEKS 1 & 2 TAKE 1 CAPSULE('40MG'$ ) BY MOUTH DAILY, AND TAKE CYMBALTA '30MG'$  TWICE DAILY. ON WEEKS 3 & 4 TAKE FLUOXETINE '20MG'$  DAILY  30 capsule 0   glycopyrrolate (ROBINUL) 2 MG tablet Take 1 tablet (2 mg total) by mouth 2 times daily. 60 tablet 2   Insulin Pen Needle (MM PEN NEEDLES) 31G X 5 MM MISC Use 1 needle once daily with Tymlos.     lamoTRIgine (LAMICTAL) 100 MG tablet TAKE 1 ('100MG'$ ) TABLET BY MOUTH DAILY WITH 1 ('200MG'$ ) TABLET DAILY FOR A TOTAL OF '300MG'$ . 30 tablet 0   lamoTRIgine (LAMICTAL) 200 MG tablet Take 200 mg by mouth daily.     lamoTRIgine (LAMICTAL) 200 MG tablet TAKE ONE TABLET BY MOUTH DAILY. TAKE WITH TWO '25MG'$  TABLETS FOR A TOTAL OF '250MG'$  DAILY. 90 tablet 0   lamoTRIgine (LAMICTAL) 200 MG tablet TAKE 1 (200 MG) TABLET BY MOUTH WITH 1 (100 MG) TABLET FOR A TOTAL OF 300 MG DAILY. 90 tablet 0   lamoTRIgine (LAMICTAL) 200 MG tablet TAKE 1 TABLET BY MOUTH DAILY. **TAKE WITH TWO '25MG'$  TABLETS FOR A TOTAL OF '250MG'$  DAILY** 90 tablet 0   lamoTRIgine (LAMICTAL) 200 MG tablet TAKE 1 TABLET BY MOUTH ONCE DAILY 90 tablet 0   lamoTRIgine (LAMICTAL) 25 MG tablet TAKE 2 TABLETS BY MOUTH 2 TIMES DAILY 180 tablet 0   lamoTRIgine (LAMICTAL) 25 MG tablet TAKE 2 TABLETS BY MOUTH DAILY. **TAKE WITH '200MG'$  TABLET TO TOTAL '250MG'$  DAILY** 60 tablet 2   lidocaine (XYLOCAINE) 2 % jelly Apply topically daily as needed. (Patient not taking: Reported on 05/06/2020)     Melatonin 10 MG TABS Take 1 tablet by mouth daily.      Methylcellulose, Laxative, (CITRUCEL PO) Take 1 tablet by  mouth 2 (two) times daily.     nitrofurantoin, macrocrystal-monohydrate, (MACROBID) 100 MG capsule Take 1 capsule (100 mg total) by mouth 2 times daily for 7 days. 14 capsule 0   oxybutynin (DITROPAN-XL) 10 MG 24 hr tablet Take 10 mg by mouth daily.     pentosan polysulfate (ELMIRON) 100 MG capsule Take 100 mg by mouth daily.     pravastatin (PRAVACHOL) 20 MG tablet Take 20 mg by mouth daily.     pravastatin (PRAVACHOL) 40 MG tablet Take 40 mg by mouth daily.     trihexyphenidyl (ARTANE) 2 MG tablet Take 1/2 tablet (1 mg total) by mouth 3 times daily before meals for excess saliva 45 tablet 11   TYMLOS 3120 MCG/1.56ML SOPN      venlafaxine (EFFEXOR) 37.5 MG tablet TAKE 1 TABLET (37.5 MG TOTAL) BY MOUTH DAILY FOR 14 DAYS, THEN 2 TABLETS (75 MG TOTAL) DAILY FOR 30 DAYS. 74 tablet 0   Vibegron (GEMTESA) 75 MG TABS Take 1 tablet by mouth once daily 30 tablet 2   vitamin B-12 (CYANOCOBALAMIN) 1000 MCG tablet Take 1,000 mcg by mouth daily.     No current facility-administered medications for this visit.   Allergies  Allergen Reactions   Codeine     Makes Pt very "hyper"   Sulfa Antibiotics     rash   Wellbutrin [Bupropion] Other (See Comments)     Review of Systems: All systems reviewed and negative except where noted in HPI.     Physical Exam:    Complete physical exam not completed due to the nature of this telehealth communication.   Gen: Awake, alert, and oriented, and well communicative. HEENT: EOMI, non-icteric sclera, NCAT, MMM Neck: Normal movement of head and neck Pulm: No labored breathing, speaking in full sentences without conversational dyspnea Derm: No apparent lesions or bruising in visible field MS: Moves all visible extremities without noticeable abnormality Psych: Pleasant, cooperative, normal speech, thought processing seemingly intact   ASSESSMENT AND PLAN;   1) Dysguesia Discussed potential etiologies  for altered taste at length today.  Outside of reflux and active dental issues, GI etiology for dysguesia is quite limited.  She does have history of seasonal and allergic rhinitis which could be a culprit.  Discussed medication ADR and reviewed that list with her today.  Discussed vitamin deficiencies with relationship to altered taste.  Lastly, Parkinson's disease linked to altered taste as well  -Trial Flonase -Check B12, zinc -If no clinical improvement, recommend following up with her Neurologist to discuss Parkinson's as potential cause, along with consideration of follow-up with her ENT to discuss additional work-up as appropriate   2) Chronic constipation - Well-controlled on current regimen  3) Parkinson's - As above, discussed relationship between her Parkinson's (and Parkinson's medications) and presenting symptoms today.  Also discussed with relation to her constipation - Continue follow-up with her Neurologist  4) History of Colon Cancer 5) History of colon polyps - Colonoscopy in 07/2019 with 3 subcentimeter tubular adenomas.  Per current guidelines, could consider repeat colonoscopy in 3-5 years.  However, given overall clinical picture, would likely defer.  Can have that conversation in 2026 as appropriate  Lindsay Bullion, DO, FACG  03/10/2021, 10:50 AM   Rikki Spearing, NP

## 2021-03-11 ENCOUNTER — Other Ambulatory Visit (HOSPITAL_BASED_OUTPATIENT_CLINIC_OR_DEPARTMENT_OTHER): Payer: Self-pay

## 2021-03-12 ENCOUNTER — Other Ambulatory Visit (HOSPITAL_BASED_OUTPATIENT_CLINIC_OR_DEPARTMENT_OTHER): Payer: Self-pay

## 2021-03-13 ENCOUNTER — Other Ambulatory Visit (HOSPITAL_BASED_OUTPATIENT_CLINIC_OR_DEPARTMENT_OTHER): Payer: Self-pay

## 2021-03-13 MED ORDER — GEMTESA 75 MG PO TABS
ORAL_TABLET | ORAL | 2 refills | Status: AC
  Filled 2021-03-13: qty 30, 30d supply, fill #0
  Filled 2021-05-14: qty 30, 30d supply, fill #1

## 2021-03-16 ENCOUNTER — Other Ambulatory Visit (HOSPITAL_BASED_OUTPATIENT_CLINIC_OR_DEPARTMENT_OTHER): Payer: Self-pay

## 2021-03-16 MED ORDER — MIRTAZAPINE 30 MG PO TABS
ORAL_TABLET | ORAL | 2 refills | Status: AC
  Filled 2021-03-16: qty 30, 30d supply, fill #0

## 2021-03-16 MED ORDER — LAMOTRIGINE 150 MG PO TABS
ORAL_TABLET | ORAL | 0 refills | Status: AC
  Filled 2021-03-16: qty 180, 90d supply, fill #0

## 2021-03-17 ENCOUNTER — Other Ambulatory Visit (HOSPITAL_BASED_OUTPATIENT_CLINIC_OR_DEPARTMENT_OTHER): Payer: Self-pay

## 2021-03-17 ENCOUNTER — Other Ambulatory Visit (INDEPENDENT_AMBULATORY_CARE_PROVIDER_SITE_OTHER): Payer: No Typology Code available for payment source

## 2021-03-17 ENCOUNTER — Other Ambulatory Visit: Payer: Self-pay

## 2021-03-17 DIAGNOSIS — R432 Parageusia: Secondary | ICD-10-CM

## 2021-03-17 DIAGNOSIS — G20 Parkinson's disease: Secondary | ICD-10-CM

## 2021-03-17 LAB — VITAMIN B12: Vitamin B-12: 222 pg/mL (ref 211–911)

## 2021-03-19 LAB — ZINC: Zinc: 77 ug/dL (ref 60–130)

## 2021-03-19 NOTE — Telephone Encounter (Signed)
The trouble having a BM that she is describing could be related to either autonomic dysfunction from the underlying Parkinson's, or potentially pelvic floor dyssynergia.  To diagnose the latter, would plan for Anorectal Manometry.  We can coordinate to have this done at [facility] unit.  In the meantime, continue taking MiraLAX and stool softeners as currently doing and encouraged to continue drinking plenty of water throughout the day.  Unsure of the etiology for being cold after 4 PM.

## 2021-03-23 ENCOUNTER — Telehealth: Payer: Self-pay | Admitting: General Surgery

## 2021-03-23 DIAGNOSIS — R432 Parageusia: Secondary | ICD-10-CM

## 2021-03-23 NOTE — Telephone Encounter (Signed)
Spoke with Lindsay Johnson and discussed her labs, the patient will start on 1060mcg of B12 and repeat her labs in 3 months. Orders placed. She would also like to know a supplement that she might be able to take for no appetitive?

## 2021-03-23 NOTE — Telephone Encounter (Signed)
Patient was notified of results via my chart message.   Lab reminder for B12 in 3 months is in epic.

## 2021-03-23 NOTE — Telephone Encounter (Signed)
-----   Message from [place], DO sent at 03/23/2021  1:31 PM EDT ----- Zinc level is normal.  B12 at the lower end of normal at 222. Will plan on parenteral repletion as below: - B12 1000 mcg PO daily - Review for dietary sources of B12 - Repeat Vitamin B12 level 3 months after starting therapy to monitor for effect

## 2021-03-26 NOTE — Telephone Encounter (Signed)
Unfortunately, not a lot of GI interventions for altered taste.  Could consider adding an appetite stimulant such as Megace, but would do that in discussion with her Neurologist.  Can send to ENT for additional evaluation of altered taste (sometimes sinusitis, rhinitis can be the cause).  Additionally, can refer to the [facility] for evaluation and possible assistance with foods that she may find more appetizing with increased caloric intake.

## 2021-03-27 ENCOUNTER — Other Ambulatory Visit (HOSPITAL_BASED_OUTPATIENT_CLINIC_OR_DEPARTMENT_OTHER): Payer: Self-pay

## 2021-03-27 MED ORDER — GEMTESA 75 MG PO TABS
ORAL_TABLET | ORAL | 2 refills | Status: DC
  Filled 2021-03-27: qty 30, 30d supply, fill #0

## 2021-04-01 ENCOUNTER — Other Ambulatory Visit (HOSPITAL_BASED_OUTPATIENT_CLINIC_OR_DEPARTMENT_OTHER): Payer: Self-pay

## 2021-04-01 MED ORDER — PRAMIPEXOLE DIHYDROCHLORIDE 0.125 MG PO TABS
0.1250 mg | ORAL_TABLET | Freq: Four times a day (QID) | ORAL | 11 refills | Status: AC
  Filled 2021-04-01 (×2): qty 120, 30d supply, fill #0

## 2021-04-02 ENCOUNTER — Other Ambulatory Visit (HOSPITAL_BASED_OUTPATIENT_CLINIC_OR_DEPARTMENT_OTHER): Payer: Self-pay

## 2021-04-13 ENCOUNTER — Other Ambulatory Visit (HOSPITAL_BASED_OUTPATIENT_CLINIC_OR_DEPARTMENT_OTHER): Payer: Self-pay

## 2021-04-13 MED ORDER — TRIHEXYPHENIDYL HCL 2 MG PO TABS
ORAL_TABLET | ORAL | 3 refills | Status: AC
  Filled 2021-04-13: qty 270, 90d supply, fill #0

## 2021-04-14 ENCOUNTER — Other Ambulatory Visit (HOSPITAL_BASED_OUTPATIENT_CLINIC_OR_DEPARTMENT_OTHER): Payer: Self-pay

## 2021-04-22 ENCOUNTER — Other Ambulatory Visit (HOSPITAL_BASED_OUTPATIENT_CLINIC_OR_DEPARTMENT_OTHER): Payer: Self-pay

## 2021-04-28 ENCOUNTER — Other Ambulatory Visit (HOSPITAL_BASED_OUTPATIENT_CLINIC_OR_DEPARTMENT_OTHER): Payer: Self-pay

## 2021-04-28 ENCOUNTER — Other Ambulatory Visit: Payer: Self-pay

## 2021-04-28 MED ORDER — LAMOTRIGINE 200 MG PO TABS
ORAL_TABLET | ORAL | 2 refills | Status: AC
  Filled 2021-04-28: qty 60, 30d supply, fill #0
  Filled 2021-06-01: qty 60, 30d supply, fill #1
  Filled 2021-07-06: qty 60, 30d supply, fill #2

## 2021-04-30 ENCOUNTER — Other Ambulatory Visit (HOSPITAL_BASED_OUTPATIENT_CLINIC_OR_DEPARTMENT_OTHER): Payer: Self-pay

## 2021-05-05 ENCOUNTER — Encounter: Payer: Self-pay | Admitting: Hematology & Oncology

## 2021-05-06 ENCOUNTER — Inpatient Hospital Stay: Payer: Self-pay | Admitting: Hematology & Oncology

## 2021-05-06 ENCOUNTER — Inpatient Hospital Stay: Payer: Self-pay

## 2021-05-14 ENCOUNTER — Other Ambulatory Visit (HOSPITAL_BASED_OUTPATIENT_CLINIC_OR_DEPARTMENT_OTHER): Payer: Self-pay

## 2021-05-19 ENCOUNTER — Other Ambulatory Visit (HOSPITAL_BASED_OUTPATIENT_CLINIC_OR_DEPARTMENT_OTHER): Payer: Self-pay

## 2021-05-19 MED ORDER — MYRBETRIQ 25 MG PO TB24
25.0000 mg | ORAL_TABLET | Freq: Every day | ORAL | 5 refills | Status: AC
  Filled 2021-05-19: qty 30, 30d supply, fill #0

## 2021-05-25 ENCOUNTER — Other Ambulatory Visit (HOSPITAL_BASED_OUTPATIENT_CLINIC_OR_DEPARTMENT_OTHER): Payer: Self-pay

## 2021-06-01 ENCOUNTER — Other Ambulatory Visit (HOSPITAL_BASED_OUTPATIENT_CLINIC_OR_DEPARTMENT_OTHER): Payer: Self-pay

## 2021-06-02 ENCOUNTER — Encounter: Payer: Self-pay | Admitting: Gastroenterology

## 2021-06-02 NOTE — Telephone Encounter (Signed)
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 While I agree with being proactive as best we can, I would not want to give something that proactively slows down stools and likely worsens the current existing issue. Additionally, there is the possibility that constipation persists without any development of bowel incontinence. Therefore, my advice at this time would be to continue treating the constipation with fiber, stool softener, and Miralax as needed. If she should develop bowel incontinence, then would need to re-evaluate if there are additional modifications we can make. Hope this is helpful.

## 2021-06-08 ENCOUNTER — Other Ambulatory Visit (HOSPITAL_BASED_OUTPATIENT_CLINIC_OR_DEPARTMENT_OTHER): Payer: Self-pay

## 2021-06-08 MED ORDER — CARBIDOPA-LEVODOPA 25-100 MG PO TABS
ORAL_TABLET | ORAL | 1 refills | Status: AC
  Filled 2021-06-08: qty 720, 90d supply, fill #0

## 2021-06-09 ENCOUNTER — Other Ambulatory Visit (HOSPITAL_BASED_OUTPATIENT_CLINIC_OR_DEPARTMENT_OTHER): Payer: Self-pay

## 2021-06-15 ENCOUNTER — Other Ambulatory Visit (HOSPITAL_BASED_OUTPATIENT_CLINIC_OR_DEPARTMENT_OTHER): Payer: Self-pay

## 2021-06-15 MED ORDER — TRIHEXYPHENIDYL HCL 2 MG PO TABS
ORAL_TABLET | ORAL | 3 refills | Status: DC
  Filled 2021-06-15: qty 270, 90d supply, fill #0

## 2021-06-15 MED ORDER — CARBIDOPA-LEVODOPA 25-100 MG PO TABS
ORAL_TABLET | ORAL | 3 refills | Status: AC
  Filled 2021-06-15: qty 900, 90d supply, fill #0
  Filled 2021-09-02: qty 300, 30d supply, fill #0
  Filled 2021-09-28: qty 280, 28d supply, fill #1
  Filled 2021-09-28: qty 20, 2d supply, fill #1
  Filled 2021-11-15: qty 300, 30d supply, fill #2
  Filled 2021-12-27: qty 300, 30d supply, fill #3
  Filled 2022-01-11: qty 300, 30d supply, fill #4
  Filled ????-??-??: fill #4

## 2021-06-17 ENCOUNTER — Other Ambulatory Visit (HOSPITAL_BASED_OUTPATIENT_CLINIC_OR_DEPARTMENT_OTHER): Payer: Self-pay

## 2021-06-17 MED ORDER — LAMOTRIGINE 200 MG PO TABS
ORAL_TABLET | ORAL | 2 refills | Status: AC
  Filled 2021-06-17: qty 60, 30d supply, fill #0

## 2021-06-17 MED ORDER — ESCITALOPRAM OXALATE 10 MG PO TABS
ORAL_TABLET | ORAL | 1 refills | Status: AC
  Filled 2021-06-17: qty 30, 30d supply, fill #0

## 2021-06-17 MED ORDER — ESCITALOPRAM OXALATE 5 MG PO TABS
ORAL_TABLET | ORAL | 0 refills | Status: AC
  Filled 2021-06-17: qty 53, 30d supply, fill #0

## 2021-06-22 ENCOUNTER — Telehealth: Payer: Self-pay

## 2021-06-22 DIAGNOSIS — G20A1 Parkinson's disease without dyskinesia, without mention of fluctuations: Secondary | ICD-10-CM

## 2021-06-22 DIAGNOSIS — G20 Parkinson's disease: Secondary | ICD-10-CM

## 2021-06-22 DIAGNOSIS — E538 Deficiency of other specified B group vitamins: Secondary | ICD-10-CM

## 2021-06-22 NOTE — Telephone Encounter (Signed)
Pt has viewed lab reminder via my chart.

## 2021-06-22 NOTE — Telephone Encounter (Signed)
My chart message sent to patient with lab reminder.   B12 lab order in epic.

## 2021-06-22 NOTE — Telephone Encounter (Signed)
-----   Message from Yevette Edwards, RN sent at 03/23/2021  4:44 PM EDT ----- Regarding: Labs Repeat B12 - need to enter order

## 2021-07-06 ENCOUNTER — Other Ambulatory Visit (HOSPITAL_BASED_OUTPATIENT_CLINIC_OR_DEPARTMENT_OTHER): Payer: Self-pay

## 2021-07-07 ENCOUNTER — Other Ambulatory Visit (HOSPITAL_BASED_OUTPATIENT_CLINIC_OR_DEPARTMENT_OTHER): Payer: Self-pay

## 2021-07-07 MED ORDER — MEMANTINE HCL 10 MG PO TABS
ORAL_TABLET | ORAL | 11 refills | Status: AC
  Filled 2021-07-07: qty 60, 30d supply, fill #0
  Filled 2021-08-16: qty 60, 30d supply, fill #1
  Filled 2021-09-13: qty 60, 30d supply, fill #2
  Filled 2021-10-11: qty 60, 30d supply, fill #3
  Filled 2021-11-15: qty 60, 30d supply, fill #4
  Filled 2021-12-13: qty 60, 30d supply, fill #5
  Filled 2022-01-17: qty 60, 30d supply, fill #6
  Filled 2022-02-14: qty 60, 30d supply, fill #7

## 2021-07-08 ENCOUNTER — Other Ambulatory Visit (HOSPITAL_BASED_OUTPATIENT_CLINIC_OR_DEPARTMENT_OTHER): Payer: Self-pay

## 2021-07-13 ENCOUNTER — Other Ambulatory Visit (HOSPITAL_BASED_OUTPATIENT_CLINIC_OR_DEPARTMENT_OTHER): Payer: Self-pay

## 2021-07-13 MED ORDER — MYRBETRIQ 25 MG PO TB24
25.0000 mg | ORAL_TABLET | Freq: Every day | ORAL | 1 refills | Status: AC
  Filled 2021-07-13: qty 30, 30d supply, fill #0

## 2021-07-21 ENCOUNTER — Other Ambulatory Visit (HOSPITAL_BASED_OUTPATIENT_CLINIC_OR_DEPARTMENT_OTHER): Payer: Self-pay

## 2021-07-21 MED ORDER — ESCITALOPRAM OXALATE 10 MG PO TABS
ORAL_TABLET | ORAL | 0 refills | Status: AC
  Filled 2021-07-21: qty 90, 90d supply, fill #0

## 2021-07-24 ENCOUNTER — Other Ambulatory Visit (HOSPITAL_BASED_OUTPATIENT_CLINIC_OR_DEPARTMENT_OTHER): Payer: Self-pay

## 2021-07-24 MED ORDER — METHYLPHENIDATE HCL 5 MG PO TABS
5.0000 mg | ORAL_TABLET | Freq: Every day | ORAL | 0 refills | Status: DC
  Filled 2021-07-24: qty 30, 30d supply, fill #0

## 2021-07-24 MED ORDER — LAMOTRIGINE 200 MG PO TABS
ORAL_TABLET | ORAL | 0 refills | Status: AC
  Filled 2021-07-24 – 2021-07-29 (×2): qty 180, 90d supply, fill #0

## 2021-07-29 ENCOUNTER — Other Ambulatory Visit (HOSPITAL_BASED_OUTPATIENT_CLINIC_OR_DEPARTMENT_OTHER): Payer: Self-pay

## 2021-08-17 ENCOUNTER — Other Ambulatory Visit (HOSPITAL_BASED_OUTPATIENT_CLINIC_OR_DEPARTMENT_OTHER): Payer: Self-pay

## 2021-08-17 MED ORDER — METHYLPHENIDATE HCL 5 MG PO TABS
5.0000 mg | ORAL_TABLET | Freq: Every day | ORAL | 0 refills | Status: AC
  Filled 2021-08-19: qty 30, 30d supply, fill #0

## 2021-08-17 MED ORDER — MYRBETRIQ 25 MG PO TB24
25.0000 mg | ORAL_TABLET | Freq: Every day | ORAL | 1 refills | Status: AC
  Filled 2021-08-17: qty 90, 90d supply, fill #0

## 2021-08-19 ENCOUNTER — Other Ambulatory Visit (HOSPITAL_BASED_OUTPATIENT_CLINIC_OR_DEPARTMENT_OTHER): Payer: Self-pay

## 2021-08-26 ENCOUNTER — Other Ambulatory Visit (HOSPITAL_BASED_OUTPATIENT_CLINIC_OR_DEPARTMENT_OTHER): Payer: Self-pay

## 2021-08-26 MED ORDER — METHYLPHENIDATE HCL 10 MG PO TABS
10.0000 mg | ORAL_TABLET | Freq: Every day | ORAL | 0 refills | Status: DC
  Filled 2021-08-26: qty 30, 30d supply, fill #0

## 2021-08-26 MED ORDER — LAMOTRIGINE 200 MG PO TABS: ORAL_TABLET | ORAL | 0 refills | Status: AC

## 2021-09-02 ENCOUNTER — Other Ambulatory Visit (HOSPITAL_BASED_OUTPATIENT_CLINIC_OR_DEPARTMENT_OTHER): Payer: Self-pay

## 2021-09-07 ENCOUNTER — Other Ambulatory Visit (HOSPITAL_BASED_OUTPATIENT_CLINIC_OR_DEPARTMENT_OTHER): Payer: Self-pay

## 2021-09-09 ENCOUNTER — Other Ambulatory Visit (HOSPITAL_BASED_OUTPATIENT_CLINIC_OR_DEPARTMENT_OTHER): Payer: Self-pay

## 2021-09-09 MED ORDER — METHYLPHENIDATE HCL 10 MG PO TABS
ORAL_TABLET | ORAL | 0 refills | Status: DC
  Filled 2021-09-15: qty 45, 90d supply, fill #0
  Filled 2021-09-28: qty 45, 30d supply, fill #0

## 2021-09-09 MED ORDER — LAMOTRIGINE 200 MG PO TABS
ORAL_TABLET | ORAL | 0 refills | Status: AC
  Filled 2021-09-09: qty 180, 90d supply, fill #0

## 2021-09-09 MED ORDER — ESCITALOPRAM OXALATE 10 MG PO TABS
10.0000 mg | ORAL_TABLET | Freq: Every day | ORAL | 0 refills | Status: AC
  Filled 2021-09-09: qty 90, 90d supply, fill #0

## 2021-09-14 ENCOUNTER — Other Ambulatory Visit (HOSPITAL_BASED_OUTPATIENT_CLINIC_OR_DEPARTMENT_OTHER): Payer: Self-pay

## 2021-09-14 MED ORDER — GLYCOPYRROLATE 1 MG PO TABS
ORAL_TABLET | ORAL | 11 refills | Status: AC
  Filled 2021-09-14: qty 60, 30d supply, fill #0
  Filled 2021-10-18: qty 60, 30d supply, fill #1
  Filled 2021-11-15: qty 60, 30d supply, fill #2
  Filled 2021-12-27: qty 60, 30d supply, fill #3

## 2021-09-14 MED ORDER — CARBIDOPA-LEVODOPA 25-100 MG PO TABS
ORAL_TABLET | ORAL | 3 refills | Status: AC
  Filled 2022-01-19: qty 900, 90d supply, fill #0

## 2021-09-14 MED ORDER — TRIHEXYPHENIDYL HCL 2 MG PO TABS
ORAL_TABLET | ORAL | 3 refills | Status: AC
  Filled 2021-09-14: qty 135, 90d supply, fill #0

## 2021-09-14 MED ORDER — MEMANTINE HCL 10 MG PO TABS: ORAL_TABLET | ORAL | 3 refills | Status: AC

## 2021-09-15 ENCOUNTER — Other Ambulatory Visit (HOSPITAL_BASED_OUTPATIENT_CLINIC_OR_DEPARTMENT_OTHER): Payer: Self-pay

## 2021-09-16 ENCOUNTER — Other Ambulatory Visit (HOSPITAL_BASED_OUTPATIENT_CLINIC_OR_DEPARTMENT_OTHER): Payer: Self-pay

## 2021-09-24 ENCOUNTER — Other Ambulatory Visit (HOSPITAL_BASED_OUTPATIENT_CLINIC_OR_DEPARTMENT_OTHER): Payer: Self-pay

## 2021-09-28 ENCOUNTER — Other Ambulatory Visit (HOSPITAL_BASED_OUTPATIENT_CLINIC_OR_DEPARTMENT_OTHER): Payer: Self-pay

## 2021-09-29 ENCOUNTER — Other Ambulatory Visit (HOSPITAL_BASED_OUTPATIENT_CLINIC_OR_DEPARTMENT_OTHER): Payer: Self-pay

## 2021-10-07 ENCOUNTER — Other Ambulatory Visit (HOSPITAL_BASED_OUTPATIENT_CLINIC_OR_DEPARTMENT_OTHER): Payer: Self-pay

## 2021-10-07 MED ORDER — TRIHEXYPHENIDYL HCL 2 MG PO TABS
1.0000 mg | ORAL_TABLET | Freq: Three times a day (TID) | ORAL | 1 refills | Status: AC
  Filled 2021-10-07: qty 135, 90d supply, fill #0

## 2021-10-12 ENCOUNTER — Other Ambulatory Visit (HOSPITAL_BASED_OUTPATIENT_CLINIC_OR_DEPARTMENT_OTHER): Payer: Self-pay

## 2021-10-12 MED ORDER — LAMOTRIGINE 200 MG PO TABS
ORAL_TABLET | ORAL | 0 refills | Status: AC
  Filled 2021-10-12: qty 180, 90d supply, fill #0

## 2021-10-19 ENCOUNTER — Other Ambulatory Visit (HOSPITAL_BASED_OUTPATIENT_CLINIC_OR_DEPARTMENT_OTHER): Payer: Self-pay

## 2021-10-19 MED ORDER — ESCITALOPRAM OXALATE 10 MG PO TABS
10.0000 mg | ORAL_TABLET | Freq: Every day | ORAL | 0 refills | Status: AC
  Filled 2021-10-19: qty 90, 90d supply, fill #0

## 2021-10-19 MED ORDER — METHYLPHENIDATE HCL 10 MG PO TABS
15.0000 mg | ORAL_TABLET | Freq: Every day | ORAL | 0 refills | Status: AC
  Filled 2021-10-28: qty 45, 30d supply, fill #0

## 2021-10-21 ENCOUNTER — Other Ambulatory Visit (HOSPITAL_BASED_OUTPATIENT_CLINIC_OR_DEPARTMENT_OTHER): Payer: Self-pay

## 2021-10-25 ENCOUNTER — Encounter: Payer: Self-pay | Admitting: Gastroenterology

## 2021-10-26 ENCOUNTER — Other Ambulatory Visit: Payer: Self-pay

## 2021-10-26 ENCOUNTER — Other Ambulatory Visit (HOSPITAL_BASED_OUTPATIENT_CLINIC_OR_DEPARTMENT_OTHER): Payer: Self-pay

## 2021-10-26 ENCOUNTER — Other Ambulatory Visit: Payer: Self-pay | Admitting: Gastroenterology

## 2021-10-26 MED ORDER — LUBIPROSTONE 24 MCG PO CAPS
24.0000 ug | ORAL_CAPSULE | Freq: Two times a day (BID) | ORAL | 0 refills | Status: DC
  Filled 2021-10-26: qty 60, 30d supply, fill #0

## 2021-10-26 MED ORDER — METHYLPHENIDATE HCL 10 MG PO TABS: ORAL_TABLET | ORAL | 0 refills | Status: AC

## 2021-10-26 NOTE — Telephone Encounter (Signed)
Medication sent to pt's pharmacy. Left voicemail for pt to call back.  ?

## 2021-10-26 NOTE — Telephone Encounter (Signed)
Can trial course of lubiprostone 24 mcg twice daily.  Please discussed with patient common side effect profile, to include diarrhea and abdominal cramping in the first 7 days, which should improve.  Nausea can also occur.  Do not use MiraLAX or other laxatives in the first 7 days, but these can be reintroduced later on if needed. ?

## 2021-10-27 NOTE — Telephone Encounter (Signed)
Left message for pt to call back  °

## 2021-10-28 ENCOUNTER — Other Ambulatory Visit (HOSPITAL_BASED_OUTPATIENT_CLINIC_OR_DEPARTMENT_OTHER): Payer: Self-pay

## 2021-10-30 ENCOUNTER — Other Ambulatory Visit (HOSPITAL_BASED_OUTPATIENT_CLINIC_OR_DEPARTMENT_OTHER): Payer: Self-pay

## 2021-10-30 MED ORDER — TRULANCE 3 MG PO TABS
1.0000 | ORAL_TABLET | Freq: Every day | ORAL | 3 refills | Status: AC
  Filled 2021-10-30: qty 30, 30d supply, fill #0

## 2021-11-02 ENCOUNTER — Other Ambulatory Visit (HOSPITAL_BASED_OUTPATIENT_CLINIC_OR_DEPARTMENT_OTHER): Payer: Self-pay

## 2021-11-02 MED ORDER — ESCITALOPRAM OXALATE 10 MG PO TABS
10.0000 mg | ORAL_TABLET | Freq: Every day | ORAL | 0 refills | Status: AC
  Filled 2021-11-02: qty 90, 90d supply, fill #0

## 2021-11-02 MED ORDER — LAMOTRIGINE 200 MG PO TABS
ORAL_TABLET | ORAL | 0 refills | Status: AC
  Filled 2021-11-02: qty 180, 90d supply, fill #0

## 2021-11-02 MED ORDER — METHYLPHENIDATE HCL 10 MG PO TABS
ORAL_TABLET | ORAL | 0 refills | Status: AC
Start: 2021-11-02 — End: ?
  Filled 2021-11-17: qty 60, 30d supply, fill #0

## 2021-11-03 ENCOUNTER — Other Ambulatory Visit (HOSPITAL_BASED_OUTPATIENT_CLINIC_OR_DEPARTMENT_OTHER): Payer: Self-pay

## 2021-11-04 ENCOUNTER — Other Ambulatory Visit (HOSPITAL_BASED_OUTPATIENT_CLINIC_OR_DEPARTMENT_OTHER): Payer: Self-pay

## 2021-11-06 ENCOUNTER — Other Ambulatory Visit (HOSPITAL_BASED_OUTPATIENT_CLINIC_OR_DEPARTMENT_OTHER): Payer: Self-pay

## 2021-11-09 ENCOUNTER — Other Ambulatory Visit (HOSPITAL_BASED_OUTPATIENT_CLINIC_OR_DEPARTMENT_OTHER): Payer: Self-pay

## 2021-11-09 MED ORDER — METHYLPHENIDATE HCL 10 MG PO TABS
ORAL_TABLET | ORAL | 0 refills | Status: AC
  Filled 2022-02-17: qty 60, 30d supply, fill #0

## 2021-11-16 ENCOUNTER — Other Ambulatory Visit (HOSPITAL_BASED_OUTPATIENT_CLINIC_OR_DEPARTMENT_OTHER): Payer: Self-pay

## 2021-11-17 ENCOUNTER — Other Ambulatory Visit (HOSPITAL_BASED_OUTPATIENT_CLINIC_OR_DEPARTMENT_OTHER): Payer: Self-pay

## 2021-11-17 MED ORDER — PRAVASTATIN SODIUM 40 MG PO TABS
ORAL_TABLET | ORAL | 0 refills | Status: DC
  Filled 2021-11-17: qty 30, 30d supply, fill #0

## 2021-12-01 ENCOUNTER — Other Ambulatory Visit (HOSPITAL_BASED_OUTPATIENT_CLINIC_OR_DEPARTMENT_OTHER): Payer: Self-pay

## 2021-12-01 MED ORDER — MYRBETRIQ 25 MG PO TB24
25.0000 mg | ORAL_TABLET | Freq: Every day | ORAL | 1 refills | Status: AC
  Filled 2021-12-01: qty 90, 90d supply, fill #0

## 2021-12-08 ENCOUNTER — Other Ambulatory Visit (HOSPITAL_BASED_OUTPATIENT_CLINIC_OR_DEPARTMENT_OTHER): Payer: Self-pay

## 2021-12-08 MED ORDER — LAMOTRIGINE 200 MG PO TABS: ORAL_TABLET | ORAL | 0 refills | Status: DC

## 2021-12-08 MED ORDER — ESCITALOPRAM OXALATE 10 MG PO TABS
10.0000 mg | ORAL_TABLET | Freq: Every day | ORAL | 0 refills | Status: AC
  Filled 2022-01-11 – 2022-01-18 (×2): qty 90, 90d supply, fill #0

## 2021-12-08 MED ORDER — METHYLPHENIDATE HCL 10 MG PO TABS
ORAL_TABLET | ORAL | 0 refills | Status: AC
  Filled 2022-01-18: qty 60, 30d supply, fill #0

## 2021-12-08 MED ORDER — METHYLPHENIDATE HCL 10 MG PO TABS
ORAL_TABLET | ORAL | 0 refills | Status: DC
  Filled 2021-12-18: qty 60, 30d supply, fill #0

## 2021-12-09 ENCOUNTER — Other Ambulatory Visit (HOSPITAL_BASED_OUTPATIENT_CLINIC_OR_DEPARTMENT_OTHER): Payer: Self-pay

## 2021-12-09 MED ORDER — PRAVASTATIN SODIUM 40 MG PO TABS
40.0000 mg | ORAL_TABLET | Freq: Every day | ORAL | 1 refills | Status: DC
  Filled 2021-12-09 – 2021-12-10 (×2): qty 90, 90d supply, fill #0
  Filled 2022-03-10: qty 90, 90d supply, fill #1

## 2021-12-10 ENCOUNTER — Other Ambulatory Visit (HOSPITAL_BASED_OUTPATIENT_CLINIC_OR_DEPARTMENT_OTHER): Payer: Self-pay

## 2021-12-13 ENCOUNTER — Other Ambulatory Visit (HOSPITAL_BASED_OUTPATIENT_CLINIC_OR_DEPARTMENT_OTHER): Payer: Self-pay

## 2021-12-14 ENCOUNTER — Other Ambulatory Visit (HOSPITAL_BASED_OUTPATIENT_CLINIC_OR_DEPARTMENT_OTHER): Payer: Self-pay

## 2021-12-16 ENCOUNTER — Other Ambulatory Visit (HOSPITAL_BASED_OUTPATIENT_CLINIC_OR_DEPARTMENT_OTHER): Payer: Self-pay

## 2021-12-17 ENCOUNTER — Other Ambulatory Visit (HOSPITAL_BASED_OUTPATIENT_CLINIC_OR_DEPARTMENT_OTHER): Payer: Self-pay

## 2021-12-17 MED ORDER — DONEPEZIL HCL 5 MG PO TABS
ORAL_TABLET | ORAL | 3 refills | Status: AC
  Filled 2021-12-17: qty 90, 90d supply, fill #0

## 2021-12-18 ENCOUNTER — Other Ambulatory Visit (HOSPITAL_BASED_OUTPATIENT_CLINIC_OR_DEPARTMENT_OTHER): Payer: Self-pay

## 2021-12-28 ENCOUNTER — Other Ambulatory Visit (HOSPITAL_BASED_OUTPATIENT_CLINIC_OR_DEPARTMENT_OTHER): Payer: Self-pay

## 2022-01-07 ENCOUNTER — Other Ambulatory Visit (HOSPITAL_BASED_OUTPATIENT_CLINIC_OR_DEPARTMENT_OTHER): Payer: Self-pay

## 2022-01-07 MED ORDER — ELMIRON 100 MG PO CAPS
ORAL_CAPSULE | ORAL | 3 refills | Status: AC
  Filled 2022-01-07: qty 270, 90d supply, fill #0
  Filled 2022-05-12: qty 270, 90d supply, fill #1

## 2022-01-08 ENCOUNTER — Other Ambulatory Visit (HOSPITAL_BASED_OUTPATIENT_CLINIC_OR_DEPARTMENT_OTHER): Payer: Self-pay

## 2022-01-11 ENCOUNTER — Other Ambulatory Visit (HOSPITAL_BASED_OUTPATIENT_CLINIC_OR_DEPARTMENT_OTHER): Payer: Self-pay

## 2022-01-11 MED ORDER — TIZANIDINE HCL 4 MG PO TABS
ORAL_TABLET | ORAL | 0 refills | Status: DC
  Filled 2022-01-11: qty 60, 15d supply, fill #0

## 2022-01-12 ENCOUNTER — Other Ambulatory Visit (HOSPITAL_BASED_OUTPATIENT_CLINIC_OR_DEPARTMENT_OTHER): Payer: Self-pay

## 2022-01-13 ENCOUNTER — Other Ambulatory Visit (HOSPITAL_BASED_OUTPATIENT_CLINIC_OR_DEPARTMENT_OTHER): Payer: Self-pay

## 2022-01-14 ENCOUNTER — Other Ambulatory Visit (HOSPITAL_BASED_OUTPATIENT_CLINIC_OR_DEPARTMENT_OTHER): Payer: Self-pay

## 2022-01-15 ENCOUNTER — Other Ambulatory Visit (HOSPITAL_BASED_OUTPATIENT_CLINIC_OR_DEPARTMENT_OTHER): Payer: Self-pay

## 2022-01-18 ENCOUNTER — Other Ambulatory Visit (HOSPITAL_BASED_OUTPATIENT_CLINIC_OR_DEPARTMENT_OTHER): Payer: Self-pay

## 2022-01-18 MED ORDER — MEMANTINE HCL 10 MG PO TABS
ORAL_TABLET | ORAL | 3 refills | Status: AC
  Filled 2022-01-18: qty 180, 90d supply, fill #0

## 2022-01-18 MED ORDER — TIZANIDINE HCL 4 MG PO TABS: ORAL_TABLET | ORAL | 0 refills | Status: AC

## 2022-01-19 ENCOUNTER — Other Ambulatory Visit (HOSPITAL_BASED_OUTPATIENT_CLINIC_OR_DEPARTMENT_OTHER): Payer: Self-pay

## 2022-01-19 MED ORDER — CARBIDOPA-LEVODOPA ER 50-200 MG PO TBCR
1.0000 | EXTENDED_RELEASE_TABLET | Freq: Four times a day (QID) | ORAL | 3 refills | Status: AC
  Filled 2022-01-19: qty 360, 90d supply, fill #0

## 2022-02-15 ENCOUNTER — Other Ambulatory Visit (HOSPITAL_BASED_OUTPATIENT_CLINIC_OR_DEPARTMENT_OTHER): Payer: Self-pay

## 2022-02-17 ENCOUNTER — Other Ambulatory Visit (HOSPITAL_BASED_OUTPATIENT_CLINIC_OR_DEPARTMENT_OTHER): Payer: Self-pay

## 2022-02-23 ENCOUNTER — Other Ambulatory Visit (HOSPITAL_BASED_OUTPATIENT_CLINIC_OR_DEPARTMENT_OTHER): Payer: Self-pay

## 2022-02-23 MED ORDER — MYRBETRIQ 25 MG PO TB24
ORAL_TABLET | ORAL | 3 refills | Status: AC
  Filled 2022-02-23: qty 15, 15d supply, fill #0

## 2022-03-10 ENCOUNTER — Other Ambulatory Visit (HOSPITAL_BASED_OUTPATIENT_CLINIC_OR_DEPARTMENT_OTHER): Payer: Self-pay

## 2022-05-13 ENCOUNTER — Other Ambulatory Visit (HOSPITAL_BASED_OUTPATIENT_CLINIC_OR_DEPARTMENT_OTHER): Payer: Self-pay

## 2022-05-14 ENCOUNTER — Other Ambulatory Visit (HOSPITAL_BASED_OUTPATIENT_CLINIC_OR_DEPARTMENT_OTHER): Payer: Self-pay

## 2022-05-23 ENCOUNTER — Other Ambulatory Visit (HOSPITAL_BASED_OUTPATIENT_CLINIC_OR_DEPARTMENT_OTHER): Payer: Self-pay

## 2022-05-24 ENCOUNTER — Other Ambulatory Visit (HOSPITAL_BASED_OUTPATIENT_CLINIC_OR_DEPARTMENT_OTHER): Payer: Self-pay

## 2022-05-24 MED ORDER — PRAVASTATIN SODIUM 40 MG PO TABS
40.0000 mg | ORAL_TABLET | Freq: Every day | ORAL | 0 refills | Status: AC
  Filled 2022-05-24: qty 90, 90d supply, fill #0

## 2022-06-03 ENCOUNTER — Other Ambulatory Visit (HOSPITAL_BASED_OUTPATIENT_CLINIC_OR_DEPARTMENT_OTHER): Payer: Self-pay

## 2022-06-03 MED ORDER — CEPHALEXIN 500 MG PO CAPS
500.0000 mg | ORAL_CAPSULE | Freq: Four times a day (QID) | ORAL | 0 refills | Status: AC
  Filled 2022-06-03: qty 4, 1d supply, fill #0

## 2022-07-26 ENCOUNTER — Encounter (HOSPITAL_BASED_OUTPATIENT_CLINIC_OR_DEPARTMENT_OTHER): Payer: Self-pay

## 2022-07-26 ENCOUNTER — Other Ambulatory Visit (HOSPITAL_BASED_OUTPATIENT_CLINIC_OR_DEPARTMENT_OTHER): Payer: Self-pay

## 2022-07-26 MED ORDER — CLONAZEPAM 0.5 MG PO TABS
0.5000 mg | ORAL_TABLET | Freq: Two times a day (BID) | ORAL | 0 refills | Status: AC | PRN
  Filled 2022-07-26: qty 60, 30d supply, fill #0

## 2022-07-27 ENCOUNTER — Other Ambulatory Visit (HOSPITAL_BASED_OUTPATIENT_CLINIC_OR_DEPARTMENT_OTHER): Payer: Self-pay

## 2022-08-09 ENCOUNTER — Other Ambulatory Visit (HOSPITAL_BASED_OUTPATIENT_CLINIC_OR_DEPARTMENT_OTHER): Payer: Self-pay

## 2022-08-27 ENCOUNTER — Other Ambulatory Visit (HOSPITAL_BASED_OUTPATIENT_CLINIC_OR_DEPARTMENT_OTHER): Payer: Self-pay

## 2022-09-16 ENCOUNTER — Encounter: Payer: Self-pay | Admitting: Gastroenterology

## 2023-01-24 ENCOUNTER — Other Ambulatory Visit (HOSPITAL_BASED_OUTPATIENT_CLINIC_OR_DEPARTMENT_OTHER): Payer: Self-pay

## 2023-01-24 MED ORDER — PRAVASTATIN SODIUM 40 MG PO TABS
40.0000 mg | ORAL_TABLET | Freq: Every day | ORAL | 3 refills | Status: AC
  Filled 2023-01-24: qty 90, 90d supply, fill #0
  Filled 2023-04-24: qty 90, 90d supply, fill #1
  Filled 2023-07-17: qty 90, 90d supply, fill #2

## 2023-03-11 ENCOUNTER — Other Ambulatory Visit (HOSPITAL_BASED_OUTPATIENT_CLINIC_OR_DEPARTMENT_OTHER): Payer: Self-pay

## 2023-03-11 MED ORDER — CEPHALEXIN 500 MG PO CAPS
500.0000 mg | ORAL_CAPSULE | Freq: Two times a day (BID) | ORAL | 0 refills | Status: AC
  Filled 2023-03-11: qty 14, 7d supply, fill #0

## 2023-07-14 ENCOUNTER — Other Ambulatory Visit (HOSPITAL_BASED_OUTPATIENT_CLINIC_OR_DEPARTMENT_OTHER): Payer: Self-pay

## 2023-07-14 MED ORDER — DEXMETHYLPHENIDATE HCL 10 MG PO TABS
10.0000 mg | ORAL_TABLET | Freq: Two times a day (BID) | ORAL | 0 refills | Status: AC
  Filled 2023-07-14: qty 30, 15d supply, fill #0

## 2023-09-03 DEATH — deceased

## 5886-04-04 DEATH — deceased
